# Patient Record
Sex: Female | Born: 1980 | Race: White | Hispanic: No | Marital: Single | Smoking: Never smoker
Health system: Southern US, Community
[De-identification: ages and names within clinical notes are randomized; demographics above are authoritative.]

## PROBLEM LIST (undated history)

## (undated) ENCOUNTER — Ambulatory Visit: Admission: EM | Payer: Medicaid Other | Source: Home / Self Care

## (undated) DIAGNOSIS — G90A Postural orthostatic tachycardia syndrome (POTS): Secondary | ICD-10-CM

## (undated) DIAGNOSIS — E079 Disorder of thyroid, unspecified: Secondary | ICD-10-CM

## (undated) DIAGNOSIS — Q131 Absence of iris: Secondary | ICD-10-CM

## (undated) DIAGNOSIS — F32A Depression, unspecified: Secondary | ICD-10-CM

## (undated) DIAGNOSIS — F329 Major depressive disorder, single episode, unspecified: Secondary | ICD-10-CM

## (undated) DIAGNOSIS — J45909 Unspecified asthma, uncomplicated: Secondary | ICD-10-CM

## (undated) DIAGNOSIS — E282 Polycystic ovarian syndrome: Secondary | ICD-10-CM

## (undated) DIAGNOSIS — G43909 Migraine, unspecified, not intractable, without status migrainosus: Secondary | ICD-10-CM

## (undated) DIAGNOSIS — F419 Anxiety disorder, unspecified: Secondary | ICD-10-CM

## (undated) DIAGNOSIS — E119 Type 2 diabetes mellitus without complications: Secondary | ICD-10-CM

## (undated) DIAGNOSIS — A1801 Tuberculosis of spine: Secondary | ICD-10-CM

## (undated) HISTORY — PX: APPENDECTOMY: SHX54

## (undated) HISTORY — DX: Polycystic ovarian syndrome: E28.2

## (undated) HISTORY — PX: EYE SURGERY: SHX253

## (undated) HISTORY — DX: Tuberculosis of spine: A18.01

---
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
## 1998-02-20 ENCOUNTER — Emergency Department (HOSPITAL_COMMUNITY): Admission: EM | Admit: 1998-02-20 | Discharge: 1998-02-20 | Payer: Self-pay | Admitting: Emergency Medicine

## 2004-02-29 ENCOUNTER — Emergency Department: Payer: Self-pay | Admitting: Emergency Medicine

## 2004-03-18 ENCOUNTER — Emergency Department: Payer: Self-pay | Admitting: Unknown Physician Specialty

## 2004-04-17 ENCOUNTER — Emergency Department: Payer: Self-pay | Admitting: Emergency Medicine

## 2004-04-18 ENCOUNTER — Ambulatory Visit: Payer: Self-pay | Admitting: Emergency Medicine

## 2004-08-19 ENCOUNTER — Emergency Department: Payer: Self-pay | Admitting: Emergency Medicine

## 2005-01-04 ENCOUNTER — Emergency Department: Payer: Self-pay | Admitting: Emergency Medicine

## 2005-01-04 ENCOUNTER — Other Ambulatory Visit: Payer: Self-pay

## 2005-06-09 ENCOUNTER — Other Ambulatory Visit: Payer: Self-pay

## 2005-06-09 ENCOUNTER — Emergency Department: Payer: Self-pay | Admitting: Emergency Medicine

## 2005-09-23 ENCOUNTER — Emergency Department (HOSPITAL_COMMUNITY): Admission: EM | Admit: 2005-09-23 | Discharge: 2005-09-23 | Payer: Self-pay | Admitting: Emergency Medicine

## 2005-10-19 ENCOUNTER — Emergency Department: Payer: Self-pay

## 2005-12-25 ENCOUNTER — Emergency Department: Payer: Self-pay | Admitting: Internal Medicine

## 2006-09-28 ENCOUNTER — Ambulatory Visit: Payer: Self-pay | Admitting: Internal Medicine

## 2006-10-20 ENCOUNTER — Ambulatory Visit: Payer: Self-pay | Admitting: Internal Medicine

## 2008-02-12 ENCOUNTER — Emergency Department: Payer: Self-pay | Admitting: Emergency Medicine

## 2009-01-14 ENCOUNTER — Emergency Department: Payer: Self-pay

## 2009-05-13 ENCOUNTER — Emergency Department: Payer: Self-pay | Admitting: Emergency Medicine

## 2009-08-30 ENCOUNTER — Emergency Department: Payer: Self-pay | Admitting: Emergency Medicine

## 2010-07-15 ENCOUNTER — Emergency Department: Payer: Self-pay | Admitting: Unknown Physician Specialty

## 2010-09-06 DIAGNOSIS — E1159 Type 2 diabetes mellitus with other circulatory complications: Secondary | ICD-10-CM | POA: Insufficient documentation

## 2010-09-06 DIAGNOSIS — I10 Essential (primary) hypertension: Secondary | ICD-10-CM | POA: Insufficient documentation

## 2010-09-06 DIAGNOSIS — E282 Polycystic ovarian syndrome: Secondary | ICD-10-CM | POA: Insufficient documentation

## 2010-09-06 DIAGNOSIS — E119 Type 2 diabetes mellitus without complications: Secondary | ICD-10-CM | POA: Insufficient documentation

## 2010-09-06 DIAGNOSIS — E785 Hyperlipidemia, unspecified: Secondary | ICD-10-CM | POA: Insufficient documentation

## 2010-09-06 DIAGNOSIS — E039 Hypothyroidism, unspecified: Secondary | ICD-10-CM | POA: Insufficient documentation

## 2010-10-09 DIAGNOSIS — G43019 Migraine without aura, intractable, without status migrainosus: Secondary | ICD-10-CM | POA: Insufficient documentation

## 2011-09-06 ENCOUNTER — Emergency Department: Payer: Self-pay | Admitting: Internal Medicine

## 2011-09-21 ENCOUNTER — Encounter: Payer: Self-pay | Admitting: Maternal & Fetal Medicine

## 2011-10-05 ENCOUNTER — Ambulatory Visit: Payer: Self-pay | Admitting: Maternal & Fetal Medicine

## 2011-10-19 ENCOUNTER — Encounter: Payer: Self-pay | Admitting: Maternal and Fetal Medicine

## 2011-10-19 LAB — URINALYSIS, COMPLETE
Bilirubin,UR: NEGATIVE
Blood: NEGATIVE
Glucose,UR: NEGATIVE mg/dL (ref 0–75)
Protein: NEGATIVE
RBC,UR: 2 /HPF (ref 0–5)
Specific Gravity: 1.025 (ref 1.003–1.030)
Squamous Epithelial: 8

## 2011-11-01 ENCOUNTER — Ambulatory Visit: Payer: Self-pay | Admitting: Maternal & Fetal Medicine

## 2011-11-05 ENCOUNTER — Encounter: Payer: Self-pay | Admitting: Obstetrics and Gynecology

## 2011-11-05 LAB — URINALYSIS, COMPLETE
Bilirubin,UR: NEGATIVE
Ketone: NEGATIVE
Ph: 6 (ref 4.5–8.0)
RBC,UR: 1 /HPF (ref 0–5)
Specific Gravity: 1.021 (ref 1.003–1.030)
Squamous Epithelial: 3
WBC UR: 2 /HPF (ref 0–5)

## 2011-11-17 ENCOUNTER — Encounter: Payer: Self-pay | Admitting: Pediatrics

## 2011-11-19 ENCOUNTER — Encounter: Payer: Self-pay | Admitting: Obstetrics and Gynecology

## 2011-11-19 LAB — TSH: Thyroid Stimulating Horm: 3.68 u[IU]/mL

## 2011-11-19 LAB — URINALYSIS, COMPLETE
Bilirubin,UR: NEGATIVE
Blood: NEGATIVE
Nitrite: NEGATIVE
Ph: 6 (ref 4.5–8.0)
Protein: NEGATIVE
RBC,UR: 4 /HPF (ref 0–5)
Specific Gravity: 1.019 (ref 1.003–1.030)

## 2011-12-01 ENCOUNTER — Ambulatory Visit: Payer: Self-pay | Admitting: Maternal & Fetal Medicine

## 2011-12-03 ENCOUNTER — Encounter: Payer: Self-pay | Admitting: Maternal & Fetal Medicine

## 2011-12-22 ENCOUNTER — Encounter: Payer: Self-pay | Admitting: Pediatric Cardiology

## 2011-12-30 ENCOUNTER — Observation Stay: Payer: Self-pay | Admitting: Obstetrics and Gynecology

## 2011-12-30 LAB — PIH PROFILE
BUN: 9 mg/dL (ref 7–18)
Calcium, Total: 9.6 mg/dL (ref 8.5–10.1)
Co2: 23 mmol/L (ref 21–32)
EGFR (Non-African Amer.): >60
Glucose: 68 mg/dL (ref 65–99)
HCT: 41.5 % (ref 35.0–47.0)
MCV: 82 fL (ref 80–100)
Osmolality: 280 (ref 275–301)
Platelet: 194 10*3/uL (ref 150–440)
Potassium: 3.7 mmol/L (ref 3.5–5.1)
RDW: 15.7 % — ABNORMAL HIGH (ref 11.5–14.5)
Uric Acid: 5.5 mg/dL (ref 2.6–6.0)
WBC: 13 10*3/uL — ABNORMAL HIGH (ref 3.6–11.0)

## 2011-12-30 LAB — PROTEIN / CREATININE RATIO, URINE
Creatinine, Urine: 131.8 mg/dL — ABNORMAL HIGH (ref 30.0–125.0)
Protein, Random Urine: 58 mg/dL — ABNORMAL HIGH (ref 0–12)

## 2011-12-30 LAB — CK TOTAL AND CKMB (NOT AT ARMC)
CK, Total: 31 U/L (ref 21–215)
CK-MB: <0.5 ng/mL — ABNORMAL LOW (ref 0.5–3.6)

## 2012-01-07 ENCOUNTER — Emergency Department: Payer: Self-pay | Admitting: Emergency Medicine

## 2012-01-07 LAB — URINALYSIS, COMPLETE
Bacteria: NONE SEEN
Bilirubin,UR: NEGATIVE
Glucose,UR: NEGATIVE mg/dL (ref 0–75)
Ketone: NEGATIVE
Nitrite: NEGATIVE
Ph: 5 (ref 4.5–8.0)
Squamous Epithelial: 3

## 2012-01-31 ENCOUNTER — Emergency Department: Payer: Self-pay | Admitting: Emergency Medicine

## 2012-01-31 LAB — URINALYSIS, COMPLETE
Bilirubin,UR: NEGATIVE
Ketone: NEGATIVE
Protein: NEGATIVE
RBC,UR: 1 /HPF (ref 0–5)
Squamous Epithelial: 5
WBC UR: 3 /HPF (ref 0–5)

## 2012-03-04 DIAGNOSIS — Q6589 Other specified congenital deformities of hip: Secondary | ICD-10-CM | POA: Insufficient documentation

## 2012-06-14 ENCOUNTER — Emergency Department: Payer: Self-pay | Admitting: Emergency Medicine

## 2012-06-23 ENCOUNTER — Emergency Department: Payer: Self-pay | Admitting: Emergency Medicine

## 2012-07-04 ENCOUNTER — Emergency Department: Payer: Self-pay | Admitting: Emergency Medicine

## 2012-08-23 DIAGNOSIS — Z87898 Personal history of other specified conditions: Secondary | ICD-10-CM | POA: Insufficient documentation

## 2012-11-02 IMAGING — CR DG CHEST 1V PORT
1 series · 1 of 1 positions shown · non-contrast
Comparison: none

REASON FOR EXAM: Pregnant, dyspenea, hypertension
COMMENTS:

[ap]
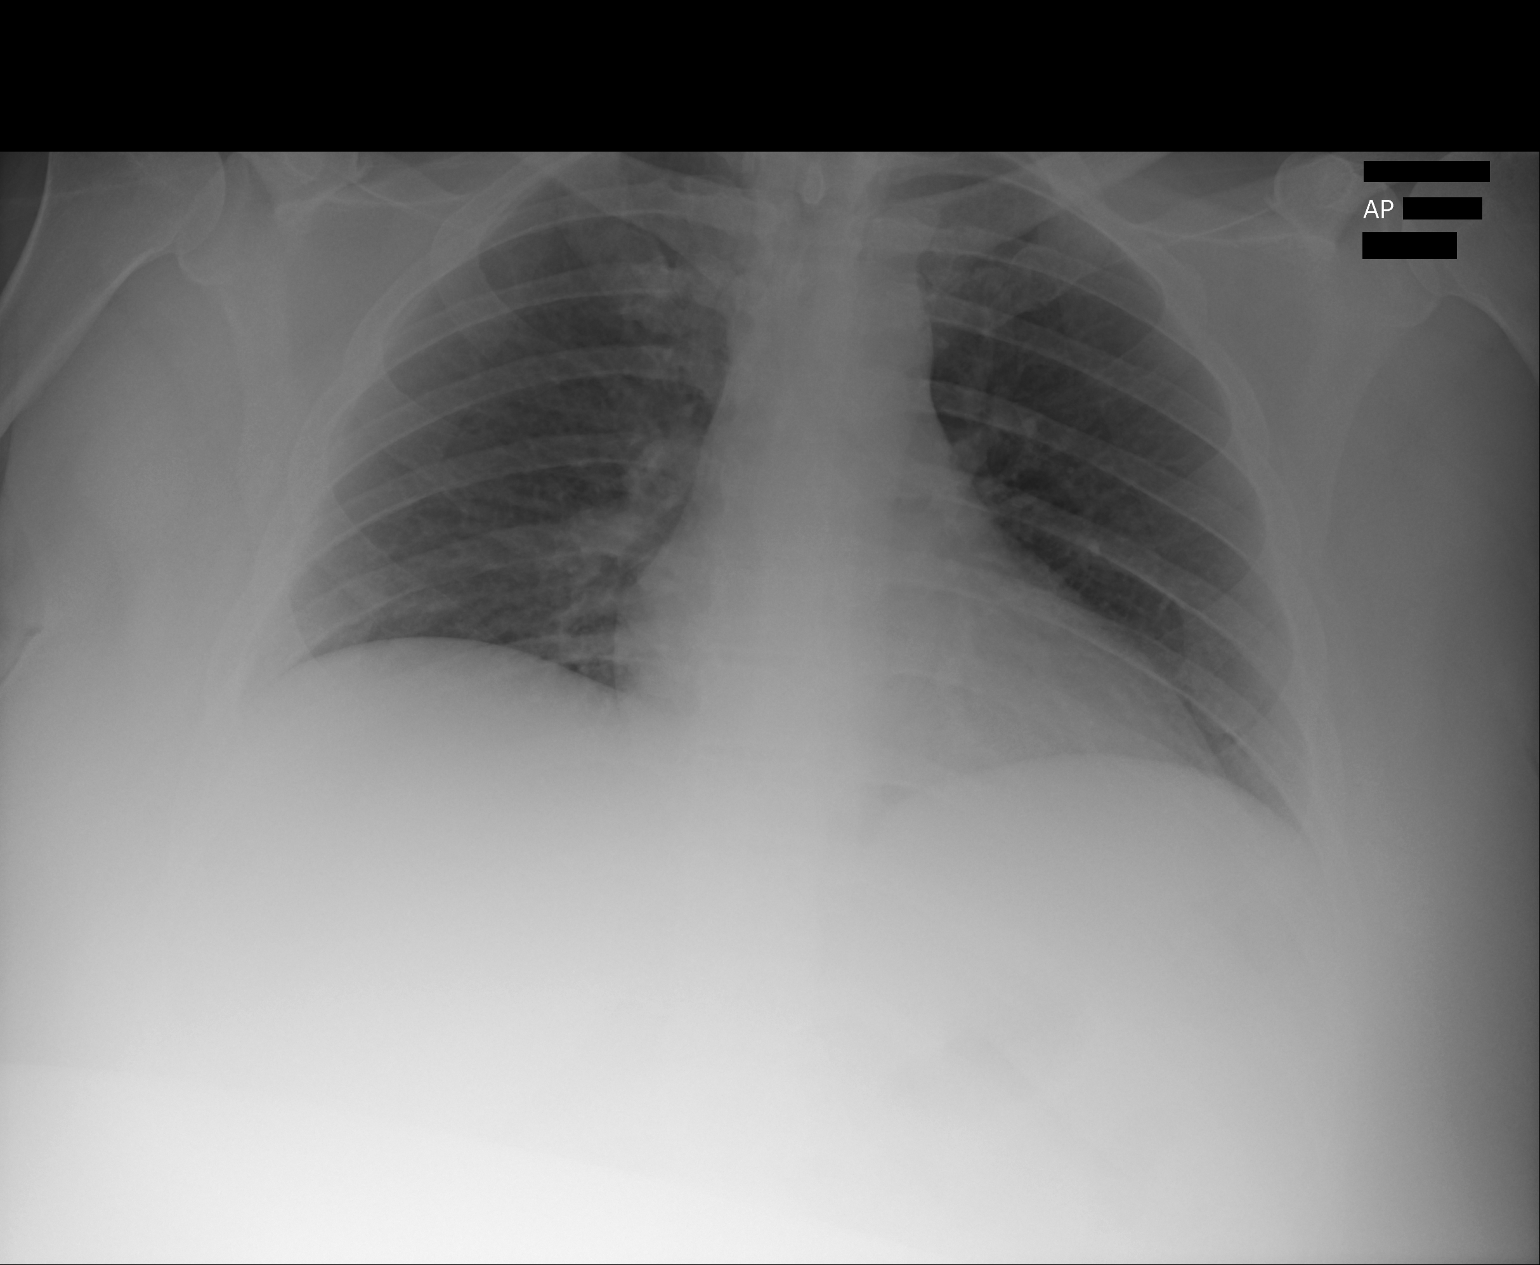

[1 of 1 positions shown; findings below may reference images not displayed]

PROCEDURE:     DXR - DXR PORTABLE CHEST SINGLE VIEW  - December 30, 2011  [DATE]

RESULT:     Comparison is made to the examination 14 January, 2009.

A there is hypoinflation. The heart is borderline enlarged which may be
normal given the gestational state. This is also made more prominent by the
hypoinflation. There is no focal infiltrate, edema, effusion or pneumothorax
evident. Bony structures appear unremarkable.
IMPRESSION: Hypoinflation. No acute cardiopulmonary disease evident.

[REDACTED]

## 2013-03-29 DIAGNOSIS — H540X55 Blindness right eye category 5, blindness left eye category 5: Secondary | ICD-10-CM | POA: Insufficient documentation

## 2013-03-29 DIAGNOSIS — F329 Major depressive disorder, single episode, unspecified: Secondary | ICD-10-CM | POA: Insufficient documentation

## 2013-03-29 DIAGNOSIS — E119 Type 2 diabetes mellitus without complications: Secondary | ICD-10-CM | POA: Insufficient documentation

## 2013-03-29 DIAGNOSIS — J45909 Unspecified asthma, uncomplicated: Secondary | ICD-10-CM | POA: Insufficient documentation

## 2013-03-29 DIAGNOSIS — F32A Depression, unspecified: Secondary | ICD-10-CM | POA: Insufficient documentation

## 2013-08-01 DIAGNOSIS — Q131 Absence of iris: Secondary | ICD-10-CM | POA: Insufficient documentation

## 2013-08-01 DIAGNOSIS — H2700 Aphakia, unspecified eye: Secondary | ICD-10-CM | POA: Insufficient documentation

## 2013-08-01 DIAGNOSIS — H11419 Vascular abnormalities of conjunctiva, unspecified eye: Secondary | ICD-10-CM | POA: Insufficient documentation

## 2013-09-18 DIAGNOSIS — K219 Gastro-esophageal reflux disease without esophagitis: Secondary | ICD-10-CM | POA: Insufficient documentation

## 2013-11-12 ENCOUNTER — Ambulatory Visit: Payer: Self-pay | Admitting: Pediatrics

## 2013-11-26 ENCOUNTER — Emergency Department: Payer: Self-pay | Admitting: Emergency Medicine

## 2014-06-19 NOTE — Consult Note (Signed)
Referral Information:   Reason for Referral 34 yo G1 at 16 weeks 6 days by ultrasound performed at [facility] on 09/08/11.  She was 10 weeks 1 day at the time of that ultrasound.  Today she presents for genetic counseling visit and follow up MFM visit. Initially She was referred for consultation due to DM on metformin (although after review of [facility] outpatient records, patient is diagnosed with 'metabolic syndrome/insulin resistance', obesity, and chronic hypertension (no medication presently).  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    Referring Physician [facility]    Prenatal Hx In reviewing her fingerstick blood glucoses, I am finding that most of her fastings are > 95 and most of her 2 hours PP are > 120 (Please note that the patient is checking 2 hours after meals and not 1 hour).   Home Medications: Medication Instructions Status  metformin 500 mg oral tablet 1 tab(s) orally 2 times a day Active  Prenatal Multivitamins oral tablet 1 tab(s) orally once a day Active  Synthroid 125 mcg oral tab 1  orally once a day Active  Aspirin Low Dose 81 mg oral tablet 1 tab(s) orally once a day Active   Allergies:   Hydrocodone: N/V/Diarrhea  Vital Signs/Notes:  Nursing Vital Signs: **Vital Signs.:   19-Aug-13 09:10   Vital Signs Type Routine   Pulse Pulse 96   Pulse source if not from Vital Sign Device dinamap   Respirations Respirations 18   Systolic BP Systolic BP 108   Diastolic BP (mmHg) Diastolic BP (mmHg) 77   Mean BP 87   BP Source  if not from Vital Sign Device dinamap   Perinatal Consult:   PMed Hx Rubella Immune    Past Medical History cont'd 1. Type 2 DM? (pt reports DM as a diagnosis, however, medical records from [facility] out[facility] describe metabolic syndrome/pcos--followed by [facility].  08/2011 Hgb A1c 5%. no history of end organ disease initially treated with metformin. Reports normalization of blood glucose  values after approx 60lb weight loss last year.  She continued metformin, but no longer needed daily blood glucose monitoring 2. Chronic Hypertension--for approx 2 years--most recently on propranolol (for both hypertension and migraines)  3. Migraines--relieved with elavil and topomax (no longer taking either med).  has been followed by [facility] in past 4. Hypothyroidism--since age 34 5. Aniridia(iris hypoplasia)--followed by [facility] specialist. [facility]  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  Pt. is legally blind 6. 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 Aniridia -- see genetic counseling note 2. Diabetes - poor control    Recommendations 1. See genetic counseling note 2. I am starting the patient on glyburide 2.5 mg twice daily 3. Detailed U/S in 2 weeks -- scheduled 4. MFM follow up in 2 weeks -- scheduled 5. Fetal echo in 4 weeks -- previously scheduled   Plan:   Genetic Counseling yes, in 4 weeks (scheduled)    Prenatal Diagnosis Options Level II US    Ultrasound at what gestational ages Monthly > 28 weeks    Antepartum Testing Starting at 32 weeks    Additional Testing Folate/prenatal vitamins    Delivery at what gestational age 639w, earlier if evidence  of fetopathy     Total Time Spent with Patient 15 minutes    >50% of visit spent in couseling/coordination of care yes    Office Use Only [address]9213  Office Visit Level 3 ( ) EST exp prob focused outpt   Coding Description: MATERNAL CONDITIONS/HISTORY INDICATION(S).   Diabetes - DM.  Electronic Signatures: Marcelino Scot (MD)  (Signed 19-Aug-13 10:45)  Authored: Referral, Home Medications, Allergies, Vital Signs/Notes, Consult, Impression, Plan, Billing, Coding  Description   Last Updated: 19-Aug-13 10:45 by Marcelino Scot (MD)

## 2014-06-19 NOTE — Consult Note (Signed)
Referral Information:   Reason for Referral 34 yo WF G1 POat 19 weeks 2 days by ultrasound performed at [facility] on 09/08/11.  She was 10 weeks 1 day at the time of that ultrasound.  Follow up consultation due to DM /metabolic syndrome/insulin resistance' on metformin and glyburide , obesity, and chronic hypertension (no medication presently).    Referring Physician [facility]    Prenatal Hx - Diabetes likely type 2 - on metformin prior to conception and now glyburide 2.5 bid - ran out recently due to pharmacy mix up  FBS most in range, 2 h pp several in 130-140 range  -HTN no meds on ASA  - aniridia - legally blind - had genetic counselling 50% inheritance - declined furhter prenatal dx- pt is able to read her cell phone  -Hypothryroidism- on synthroid - dose dropped in pregnancy  -pt c/o abdominal yeast under pannous bled after u/s has been using cream    Past Obstetrical Hx primigravida   Home Medications: Medication Instructions Status  metformin 500 mg oral tablet 1 tab(s) orally 2 times a day Active  Prenatal Multivitamins oral tablet 1 tab(s) orally once a day Active  Synthroid 125 mcg oral tab 1  orally once a day Active  Aspirin Low Dose 81 mg oral tablet 1 tab(s) orally once a day Active  glyBURIDE 2.5 milligram(s) orally 2 times a day Active   Allergies:   Hydrocodone: N/V/Diarrhea  Vital Signs/Notes:  Nursing Vital Signs: **Vital Signs.:   05-Sep-13 09:11   Vital Signs Type Routine   Temperature Temperature (F) 98   Celsius 36.6   Temperature Source oral   Pulse Pulse 93   Pulse source if not from Vital Sign Device dinamap   Respirations Respirations 16   Systolic BP Systolic BP 124   Diastolic BP (mmHg) Diastolic BP (mmHg) 68   Mean BP 86   BP Source  if not from Vital Sign Device dinamap   Perinatal Consult:   PMed Hx Rubella Immune    Past Medical History cont'd 1.Likely  Type 2 DM on meds in pregnancy(pt reports DM as a diagnosis, however, medical  records from [facility] describe metabolic syndrome/pcos--followed by [facility].  08/2011 Hgb A1c 5%. no history of end organ disease initially treated with metformin. Reports normalization of blood glucose values after approx 60lb weight loss last year.  She continued metformin, but no longer needed daily blood glucose monitoring 2. Chronic Hypertension--for approx 2 years--now off  propranolol  usign ASA 81 mg  3. Migraines--relieved with elavil and topomax (no longer taking either med).  has been followed by [facility] in past 4. Hypothyroidism--since age 43 5. Aniridia(iris hypoplasia)--followed by [facility]. She has been seen by medical genetics at [facility].  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  Pt. is legally blind declined testing . 6. 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    Soc Hx married, no tobacco, etoh, ilcit drug use   Review Of Systems:   Tolerating Diet Yes      Routine UA:  19-Aug-13 09:08    Color (UA) Amber   Glucose (UA) Negative   Bilirubin (UA) Negative   Ketones (UA) Trace   Specific Gravity (UA) 1.025   Blood (UA) Negative   pH (UA) 5.0   Protein (UA) Negative   Nitrite (UA) Negative   Leukocyte Esterase (UA) Trace (Result(s) reported  on 19 Oct 2011 at 10:26AM.)   RBC (UA) 2 /HPF   WBC (UA) 14 /HPF   Bacteria (UA) 3+   Epithelial Cells (UA) 8 /HPF   Mucous (UA) PRESENT   Amorphous Crystal (UA) PRESENT (Result(s) reported on 19 Oct 2011 at 10:26AM.)  05-Sep-13 09:02    Color (UA) Yellow   Clarity (UA) Hazy   Glucose (UA) Negative   Bilirubin (UA) Negative   Ketones (UA) Negative   Specific Gravity (UA) 1.021   Blood (UA) Negative   pH (UA) 6.0   Protein (UA) Negative   Nitrite (UA) Negative   Leukocyte Esterase (UA) Trace (Result(s) reported on 05 Nov 2011 at 11:05AM.)   RBC (UA) 1 /HPF   WBC (UA) 2 /HPF   Bacteria (UA) 1+    Epithelial Cells (UA) 3 /HPF   Mucous (UA) PRESENT   Calcium Oxalate Crystal (UA) PRESENT (Result(s) reported on 05 Nov 2011 at 11:05AM.)     Additional Lab/Radiology Notes FBS-93 111 (2/7 >100) some off glyburide  2h pp breakfast 95-159 (3/7 >120) 2 h lunch 92 -132 (2/3 >120) 2h supper 120-135 (1/2 >120)   Impression/Recommendations:   Impression 1. Diabetes likely Type 2 onmetformin and glyburide   - most FBS in range - a few 2 h pp out of range ran out of glyburide  2. HTN normal BP today no meds on ASA 3 hypothyridism on synthroid  4 aniridia - low vision legally blind able to function well 4 candida on abdomen    Recommendations 1 . restart glyburide continue metformin - I reviewed wiht pt that while metformin is a small molecule and crosses placenta there is emerging safety data in pregnancy - I offered option of stopping and adjusting only glyburide - pt prefers to continue metformin will check Hgb A1c  2. MFM follow up in 2 weeks -- scheduled to adjust meds  3. Fetal echo in 3 weeks -- previously scheduled 4 follow up u/s - anatomy limited by pt size  5 nystatin powder for pannous and gauze to keep dry suggested 6 TFTs ordered   Plan:   Genetic Counseling yes, done    Prenatal Diagnosis Options Level II US, done    Ultrasound at what gestational ages Monthly > 28 weeks    Antepartum Testing Starting at 32 weeks    Delivery Mode Vaginal    Additional Testing Thyroid panel, Folate/prenatal vitamins    Delivery at what gestational age 4639w, earlier if evidence of fetopathy     Total Time Spent with Patient 15 minutes    >50% of visit spent in couseling/coordination of care yes    Office Use Only 99213  Office Visit Level 3 (15min) EST exp prob focused outpt   Coding Description: MATERNAL CONDITIONS/HISTORY INDICATION(S).   Diabetes - DM.  Electronic Signatures: Rondall AllegraLivingston, Anella Nakata Gresham (MD)  (Signed 05-Sep-13 13:07)  Authored: Referral, Home  Medications, Allergies, Vital Signs/Notes, Consult, Exam, Lab, Lab/Radiology Notes, Impression, Plan, Billing, Coding Description   Last Updated: 05-Sep-13 13:07 by Rondall AllegraLivingston, Teniqua Marron Gresham (MD)

## 2014-06-19 NOTE — Consult Note (Signed)
Referral Information:   Reason for Referral 34 yo WF G1 POat 21 weeks 2 days by ultrasound [facility].  She was 10 weeks 1 day at the time of that ultrasound.  Follow up consultation due to DM /metabolic syndrome/insulin resistance' on metformin and glyburide , obesity, and chronic hypertension (no medication presently).    Referring Physician [facility]    Prenatal Hx - Diabetes likely type 2 - on metformin prior to conception and now added glyburide 2.5 bid - ran out and was to restart  2 weeks ago - she was unable to refill meds at pharmacy so didn't start until yesterday - RBS 90 minutes after eating 190 here to recheck blood sugars and ajust meds  FBS most in range, 2 h pp several in 150-160 range on metformin only  -HTN no BP meds on ASA  - aniridia - legally blind - had genetic counselling 50% inheritance - declined further prenatal dx- pt is able to read her cell phone  -Hypothryroidism- on synthroid - dose dropped in pregnancy  -abdominal yeast under pannous Rx given last visit    Past Obstetrical Hx primigravida   Home Medications: Medication Instructions Status  metformin 500 mg oral tablet 1 tab(s) orally 2 times a day Active  Prenatal Multivitamins oral tablet 1 tab(s) orally once a day Active  Synthroid 125 mcg oral tab 1  orally once a day Active  Aspirin Low Dose 81 mg oral tablet 1 tab(s) orally once a day Active  glyBURIDE 2.5 milligram(s) orally 2 times a day Active   Allergies:   Hydrocodone: N/V/Diarrhea  Vital Signs/Notes:  Nursing Vital Signs: **Vital Signs.:   19-Sep-13 09:37   Vital Signs Type Routine   Temperature Temperature (F) 98.3   Celsius 36.8   Temperature Source oral   Pulse Pulse 94   Pulse source if not from Vital Sign Device dinamap   Respirations Respirations 16   Systolic BP Systolic BP 123   Diastolic BP (mmHg) Diastolic BP (mmHg) 65   Mean BP 84   BP Source  if not from Vital Sign Device dinamap    Perinatal Consult:   PMed Hx Rubella Immune    Past Medical History cont'd 1.Likely  Type 2 DM on meds in pregnancy(pt reports DM as a diagnosis, however, [facility] describe metabolic syndrome/pcos--follow[facility] by [facility].  08/2011 Hgb A1c 5%. no history of end organ disease initially treated with metformin. Reports normalization of blood glucose values after approx 60lb weight loss last year.  She continued metformin, but no longer needed daily blood glucose monitoring 2. Chronic Hypertension--for approx 2 years--now off  propranolol  usign ASA 81 mg  3. Migraines--relieved with elavil and topomax (no longer taking either med).  has been followed by [facility] in past 4. Hypothyroidism--since age 34 5. Aniridia(iris hypoplasia)--followed by [facility]. [facility]  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  Pt. is legally blind declined testing . 6. 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     Routine UA:  05-Sep-13 09:02    Color (UA) Yellow   Clarity (UA)  Hazy   Glucose (UA) Negative   Bilirubin (UA) Negative   Ketones (UA) Negative   Specific Gravity (UA) 1.021   Blood (UA) Negative   pH (UA) 6.0   Protein (UA) Negative   Nitrite (UA) Negative   Leukocyte Esterase (UA) Trace (Result(s) reported on 05 Nov 2011 at 11:05AM.)   RBC (UA) 1 /HPF   WBC (UA) 2 /HPF   Bacteria (UA) 1+   Epithelial Cells (UA) 3 /HPF   Mucous (UA) PRESENT   Calcium Oxalate Crystal (UA) PRESENT (Result(s) reported on 05 Nov 2011 at 11:05AM.)     Additional Lab/Radiology Notes FBS-84 115 (2/7 >100)  off glyburide  2h pp breakfast 90-192 (5/7 >120) 2 h lunch 110 -156 (6/7  >120) 2h supper 119-156 (4/5 >120)   Impression/Recommendations:   Impression 1. Diabetes likely Type 2 onmetformin and just started glyburide   - most FBS in range - most 2 h pp out of range off glyburide - just started Yesterday! I encouraged her to adhere to glyburide re-reviewed worries for fetal macrosomia and more complications if not euglycemic. 2. HTN normal BP today no meds on ASA  3 hypothyridism on synthroid  4 aniridia - low vision legally blind able to function well 4 candida on abdomen improved 4 Increased BMI weight 254 BMI 39    Recommendations 1 . TAKE glyburide will increase to 5 in am and 2.5 at night continue metformin - I reviewed wiht pt that while metformin is a small molecule and crosses placenta there is emerging safety data in pregnancy - I offered option of stopping and adjusting only glyburide - pt prefers to continue metformin will check Hgb A1c today  2. MFM follow up in 2 weeks -- scheduled to adjust meds advised to take them and notify office if pharmacy won't fill 3. Fetal echo already scheduled 4 follow up u/s - anatomy limited by pt size  5 conitnue nystatin powder for pannous and gauze to keep dry suggested 6 TFTs ordered today   Plan:   Genetic Counseling yes, done    Prenatal Diagnosis Options Level II [place], done    Ultrasound at what gestational ages Monthly > 28 weeks    Antepartum Testing Starting at 32 weeks    Delivery Mode Vaginal    Additional Testing Thyroid panel, Folate/prenatal vitamins    Delivery at what gestational age 80w, earlier if evidence of fetopathy     Total Time Spent with Patient 15 minutes    >50% of visit spent in couseling/coordination of care yes    Office Use Only 99213  Office Visit Level 3 ( ) EST exp prob focused outpt   Coding Description: MATERNAL CONDITIONS/HISTORY INDICATION(S).   Diabetes - DM.  Electronic Signatures: Rondall Allegra (MD)  (Signed 19-Sep-13 13:30)  Authored:  Referral, Home Medications, Allergies, Vital Signs/Notes, Consult, Exam, Lab, Lab/Radiology Notes, Impression, Plan, Billing, Coding Description   Last Updated: 19-Sep-13 13:30 by Rondall Allegra (MD)

## 2014-06-19 NOTE — Consult Note (Signed)
Referral Information:   Reason for Referral 34 yo G1 at 12 weeks 6 days by ultrasound performed at [facility] on 09/08/11.  She was 10 weeks 1 day at the time of that ultrasound.  She is referred for consultation due to DM on metformin (although after review of [facility] outpatient records, patient is diagnosed with 'metabolic syndrome/insulin resistance', obesity, and chronic hypertension (no medication presently).  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    Referring Physician [facility]    Prenatal Hx 08/2011 Hgb A1c 5%.  She was diagnosed with metabolic syndrome based, in part, on a Hgb A1c of 6%    Past Obstetrical Hx nuliparous history of infertility   Home Medications: Medication Instructions Status  Synthroid 150 mcg (0.15 mg) oral tablet 1 tab(s) orally once a day Active  metformin 500 mg oral tablet 1 tab(s) orally 2 times a day Active  Prenatal Multivitamins oral tablet 1 tab(s) orally once a day Active   Allergies:   Hydrocodone: N/V/Diarrhea  Vital Signs/Notes:  Nursing Vital Signs: **Vital Signs.:   22-Jul-13 09:05   Vital Signs Type Routine   Temperature Temperature (F) 97   Celsius 36.1   Temperature Source oral   Pulse source if not from Vital Sign Device dinamap   Respirations Respirations 16   Systolic BP Systolic BP 118   Diastolic BP (mmHg) Diastolic BP (mmHg) 73   Mean BP 88   BP Source  if not from Vital Sign Device dinamap, lf arm large cuff pt sitting no BP medication   Perinatal Consult:   PMed Hx Rubella Immune    Past Medical History cont'd 1. Type 2 DM? (pt reports DM as a diagnosis, however, medical records from [facility] outpt describe metabolic syndrome/pcos--followed by [facility] primary [facility].  08/2011 Hgb A1c 5%. no history of end organ disease initially treated with metformin. Reports normalization of blood glucose values after approx 60lb weight loss last year.  She continued metformin, but no  longer needed daily blood glucose monitoring 2. Chronic Hypertension--for approx 2 years--most recently on propranolol (for both hypertension and migraines)  3. Migraines--relieved with elavil and topomax (no longer taking either med).  has been followed by [facility] neurology in past 4. Hypothyroidism--since age 40 5. Aniridia(iris hypoplasia)--followed by [facility] retina specialist. She has been seen by medical genetics at [facility].  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  Pt. is legally blind 6. 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 Chronic hypertension--we reviewed concerns associated with chronic hypertension such as superimposed preeclampsia and fetal growth restriction.  We discussed the fetal surveillance (outlined below) and baseline lab assessments recommended.  2. Type 2 DM(?)/metabolic syndrome/PCOS--She reports diagnosis of DM approximately 1 year ago and initially controlled blood glucose with just metformin and low carbohydrate diet.   We discussed the risks of pregestational diabetes, including worsening glycemic control, pregnancy related hypertensive disorders, fetal growth restriction, polyhydramnios, fetal congenital anomalies (a Hgb A1c of less than 8% is associated with a 3% risk for congenital anomaly), fetal demise, and iatrogenic preterm delivery.  We encouraged her to maintain good glycemic control to lower the risk of these adverse outcomes.    I REVEIWED HER [facility] OUTPT RECORDS FURTHER AFTER OUR VISIT AND SHE DOES NOT APPEAR TO HAVE  A CLEAR DIAGNOSIS OF TYPE 2 DM BUT OF METABOLIC SYNDROME/INSULIN RESISTANCE. The recommendations for surevillance and pregnancy evaluation, however, are not affected.  We can discuss this further with pt at follow up visit.  3. obesity--we reviewed concerns related to  elevated bmi in pregnancy and surveillance during pregnancy (risk and surveillance  those outlined for hypertension and DM, including increased risk for operative delivery, anomalies, and need for growth surveillance).   4. Emeline Darlingniridia--She will meet with our genetic counselor in 4 weeks. Prenatal diagnosis is an option (she has PAX 6 mutation) 5. Migraines--topirimate helped symptoms in past.  Advised patient this therapy could be continued during pregnancy.   6. Fatigue--symptoms preceeded pregnancy.  She had pulmonary evaluation/sleep study and was NOT diagnosed with sleep apnea but was noted to have upper airway obstructive symptoms.  They did not feel CPAP would be beneficial. They advised weight reduction. 7. Hypothryoidism--well controlled on synthroid.    Recommendations 1. Continue monitoring the finger stick glucose.  I wrote prescription for glucometer and supplies today.  I recommended  adjustment of diet to maintain fastings <95 mg/dl and one-hour post-prandials <140 mg/dl.  If there are any abnormalities with her glycemic control, we will initiate insulin as necessary.  We have also referred [facility] to [facility] for both nutrition counseling and diabetes care.  We addressed the possiblity that she may require insulin therapy during pregnancy.  I would consider initiation of glyburide if we observe slight elevations in her blood glucose values.   2.Nutrition counseling--she reports difficulty with diet during pregnancy due to her concerns about the low carbohydrate diet she previously followed and her need to balance meals during pregnancy.  The low carbohydrate diet may be appropriate for her, however, she should review food diary with nutritionist. 3.  Baseline p:c ratio and metabolic panel/lfts, if not already obtained.  If the p:c ratio is abnormal, she should have a 24 hour urine protein.  4. Baseline EKG; maternal echocardiogram should be considered if the patient has an abnormal EKG  or cardiac symptoms  5. Baseline thyroid function tests each trimester.  6. Close monitoring of blood pressure and appropriate antihypertensive medication should this be necessary. 7.Second trimester targeted ultrasound (~18 weeks). 8. Fetal echocardiogram at 22 weeks (scheduled) 9. Antepartum testing and growth ultrasound surveillance, as outlined below.  10. Delivery should be considered between 37 and 40 weeks, with consideration for testing of fetal lung maturity if initiating delivery before 39 weeks 11. We have scheduled a follow up mfm consult and genetic consult in 4 weeks.   Plan:   Genetic Counseling yes, in 4 weeks (scheduled)    Prenatal Diagnosis Options Level II US    Ultrasound at what gestational ages Monthly > 28 weeks    Antepartum Testing Starting at 32 weeks    Additional Testing Folate/prenatal vitamins    Delivery at what gestational age 7539w, earlier if evidence of fetopathy     Total Time Spent with Patient 45 minutes    >50% of visit spent in couseling/coordination of care yes    Office Use Only 99243  Level 3 (40min) NEW office consult detailed   Coding Description: MATERNAL CONDITIONS/HISTORY INDICATION(S).   Chronic HTN.   Diabetes - DM.   Obesity - BMI greater than equal to 30.   Thyroid dysfunction in pregnancy.  Electronic Signatures: Consuelo PandySmall, Jyden Kromer (MD)  (Signed 22-Jul-13 13:25)  Authored: Referral, Home Medications, Allergies, Vital Signs/Notes, Consult, Impression, Plan, Billing, Coding Description   Last Updated:  22-Jul-13 13:25 by Consuelo Pandy (MD)

## 2014-07-10 NOTE — H&P (Signed)
L&D Evaluation:  History:   HPI 34 yo G1 at 6582w0d by D=10w US derived EDC of 03/31/2011 presenting with worsening SOB in the last 48hrs in the setting of history of asthma, CHTN (on unknown dose propranolol outside of pregnancy), anxiety/depression, as well as diabetes.  Was seen in clinic today, BP 130/84 negative protein on urine dip.  The patient had a peak flow of 300, was given Rx for albuterol and told to represent if symptoms worsen. She denies cough, fevers, chills, increased edema (only 1lb weight gain in the last week per clinic records), lower extremity pain.  States chest pain feel like pressure.  Decreased fetal movement but did not feel movement until about a week ago, no LOF, no VB, no ctx  PNL: A + / ABSC neg / RI / VZI / HIV neg / HBsAg neg / pap wnl with neg HPV / GC&CT negative.  Normal baseline EKG, no baseline 24-hr urine.  Hgb A1C 4.5.TSH 3.68, FT4 1.01    Presents with shortness of breath    Patient's Medical History Asthma  Diabetes  Hypertension  anxiety, depression, migraines    Patient's Surgical History Appendectomy  cataract surgery (legaly blind), wisdome teeth extraction    Medications Pre Natal Vitamins  ASA 81mg  po daily, metformin 500mg  po bid, glyburide 5mg  po qAM 2.5mg  qPM, zantac 150mg  po daily    Allergies Hydrocodone    Social History none    Family History Non-Contributory   ROS:   ROS See HPI   Exam:   Vital Signs BP >140/90  190/100    Urine Protein UA and urine protein/creatnine dip pendig    General no apparent distress    Mental Status clear    Chest clear    Heart normal sinus rhythm    Abdomen gravid, non-tender    Estimated Fetal Weight Average for gestational age    Edema 1+    Reflexes 1+    Clonus negative    FHT + FHT difficulty monitoring secondary to patient habitus   Impression:   Impression evaluation for PIH, 34 yo G1 @ 5582w0d with CHTN vs SiPreE   Plan:   Plan EFM/NST, monitor BP, PIH panel    Comments  - Check pre-eclampsia labs and urine pr/cr ratio.  If laboratory evaluation supports pre-e will start patient on magnesium sulfate and given gestational age will initiate transfer to [facility], start BMZ, start ampicillin for GBS unknown - Hydralazine 5mg  IV once, repeat 10mg  IV once if BP in 20 minutes if remain 160/110, with a third dose of 10mg  if remains elevated - CXR to r/o cardiac enlargement/cardiomyopathy - O2 sats normal on RA - Low suspicion for PE, not tachycardic - EKG   Electronic Signatures: Lorrene ReidStaebler, Cordella Nyquist M (MD)  (Signed 30-Oct-13 02:46)  Authored: L&D Evaluation   Last Updated: 30-Oct-13 02:46 by Lorrene ReidStaebler, Kirbie Stodghill M (MD)

## 2014-10-07 ENCOUNTER — Encounter: Payer: Self-pay | Admitting: *Deleted

## 2014-10-07 ENCOUNTER — Emergency Department
Admission: EM | Admit: 2014-10-07 | Discharge: 2014-10-07 | Disposition: A | Discharge status: Home / Self Care | Payer: Medicaid Other | Attending: Student | Admitting: Student

## 2014-10-07 ENCOUNTER — Emergency Department: Payer: Medicaid Other

## 2014-10-07 DIAGNOSIS — Y92410 Unspecified street and highway as the place of occurrence of the external cause: Secondary | ICD-10-CM | POA: Diagnosis not present

## 2014-10-07 DIAGNOSIS — Y998 Other external cause status: Secondary | ICD-10-CM | POA: Insufficient documentation

## 2014-10-07 DIAGNOSIS — S161XXA Strain of muscle, fascia and tendon at neck level, initial encounter: Secondary | ICD-10-CM | POA: Diagnosis not present

## 2014-10-07 DIAGNOSIS — Y9389 Activity, other specified: Secondary | ICD-10-CM | POA: Insufficient documentation

## 2014-10-07 DIAGNOSIS — S20212A Contusion of left front wall of thorax, initial encounter: Secondary | ICD-10-CM | POA: Insufficient documentation

## 2014-10-07 DIAGNOSIS — S199XXA Unspecified injury of neck, initial encounter: Secondary | ICD-10-CM | POA: Diagnosis present

## 2014-10-07 MED ORDER — IBUPROFEN 800 MG PO TABS
800.0000 mg | ORAL_TABLET | Freq: Three times a day (TID) | ORAL | Status: DC | PRN
Start: 2014-10-07 — End: 2014-10-07

## 2014-10-07 MED ORDER — TRAMADOL HCL 50 MG PO TABS
50.0000 mg | ORAL_TABLET | Freq: Four times a day (QID) | ORAL | Status: DC | PRN
Start: 2014-10-07 — End: 2014-10-07

## 2014-10-07 MED ORDER — IBUPROFEN 800 MG PO TABS: 800.0000 mg | ORAL_TABLET | Freq: Three times a day (TID) | ORAL | Status: DC | PRN

## 2014-10-07 MED ORDER — TRAMADOL HCL 50 MG PO TABS: 50.0000 mg | ORAL_TABLET | Freq: Four times a day (QID) | ORAL | Status: DC | PRN

## 2014-10-07 MED ORDER — CYCLOBENZAPRINE HCL 10 MG PO TABS: 10.0000 mg | ORAL_TABLET | Freq: Three times a day (TID) | ORAL | Status: DC | PRN

## 2014-10-07 MED ORDER — CYCLOBENZAPRINE HCL 10 MG PO TABS
10.0000 mg | ORAL_TABLET | Freq: Once | ORAL | Status: AC
  Administered 2014-10-07: 10 mg via ORAL
  Filled 2014-10-07: qty 1

## 2014-10-07 MED ORDER — IBUPROFEN 800 MG PO TABS
800.0000 mg | ORAL_TABLET | Freq: Once | ORAL | Status: AC
  Administered 2014-10-07: 800 mg via ORAL
  Filled 2014-10-07: qty 1

## 2014-10-07 MED ORDER — TRAMADOL HCL 50 MG PO TABS
50.0000 mg | ORAL_TABLET | Freq: Once | ORAL | Status: AC
  Administered 2014-10-07: 50 mg via ORAL
  Filled 2014-10-07: qty 1
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
## 2014-10-07 NOTE — Discharge Instructions (Signed)

## 2015-02-04 ENCOUNTER — Observation Stay
Admission: EM | Admit: 2015-02-04 | Discharge: 2015-02-06 | Disposition: A | Discharge status: Home / Self Care | Payer: Medicaid Other | Attending: Surgery | Admitting: Surgery

## 2015-02-04 ENCOUNTER — Emergency Department: Payer: Medicaid Other

## 2015-02-04 DIAGNOSIS — R109 Unspecified abdominal pain: Secondary | ICD-10-CM | POA: Diagnosis present

## 2015-02-04 DIAGNOSIS — K8050 Calculus of bile duct without cholangitis or cholecystitis without obstruction: Secondary | ICD-10-CM | POA: Insufficient documentation

## 2015-02-04 DIAGNOSIS — Z9889 Other specified postprocedural states: Secondary | ICD-10-CM | POA: Insufficient documentation

## 2015-02-04 DIAGNOSIS — Z825 Family history of asthma and other chronic lower respiratory diseases: Secondary | ICD-10-CM | POA: Diagnosis not present

## 2015-02-04 DIAGNOSIS — K8020 Calculus of gallbladder without cholecystitis without obstruction: Secondary | ICD-10-CM | POA: Diagnosis not present

## 2015-02-04 DIAGNOSIS — Z9049 Acquired absence of other specified parts of digestive tract: Secondary | ICD-10-CM | POA: Diagnosis not present

## 2015-02-04 DIAGNOSIS — F329 Major depressive disorder, single episode, unspecified: Secondary | ICD-10-CM | POA: Diagnosis not present

## 2015-02-04 DIAGNOSIS — K8010 Calculus of gallbladder with chronic cholecystitis without obstruction: Principal | ICD-10-CM | POA: Insufficient documentation

## 2015-02-04 DIAGNOSIS — I10 Essential (primary) hypertension: Secondary | ICD-10-CM | POA: Insufficient documentation

## 2015-02-04 DIAGNOSIS — Z6839 Body mass index (BMI) 39.0-39.9, adult: Secondary | ICD-10-CM | POA: Diagnosis not present

## 2015-02-04 DIAGNOSIS — J45909 Unspecified asthma, uncomplicated: Secondary | ICD-10-CM | POA: Insufficient documentation

## 2015-02-04 DIAGNOSIS — Z8249 Family history of ischemic heart disease and other diseases of the circulatory system: Secondary | ICD-10-CM | POA: Insufficient documentation

## 2015-02-04 DIAGNOSIS — E039 Hypothyroidism, unspecified: Secondary | ICD-10-CM | POA: Diagnosis not present

## 2015-02-04 DIAGNOSIS — Z79899 Other long term (current) drug therapy: Secondary | ICD-10-CM | POA: Insufficient documentation

## 2015-02-04 DIAGNOSIS — E119 Type 2 diabetes mellitus without complications: Secondary | ICD-10-CM | POA: Diagnosis not present

## 2015-02-04 DIAGNOSIS — F419 Anxiety disorder, unspecified: Secondary | ICD-10-CM | POA: Insufficient documentation

## 2015-02-04 HISTORY — DX: Type 2 diabetes mellitus without complications: E11.9

## 2015-02-04 HISTORY — DX: Major depressive disorder, single episode, unspecified: F32.9

## 2015-02-04 HISTORY — DX: Unspecified asthma, uncomplicated: J45.909

## 2015-02-04 HISTORY — DX: Disorder of thyroid, unspecified: E07.9

## 2015-02-04 HISTORY — DX: Depression, unspecified: F32.A

## 2015-02-04 HISTORY — DX: Anxiety disorder, unspecified: F41.9

## 2015-02-04 LAB — LIPASE, BLOOD: Lipase: 25 U/L (ref 11–51)

## 2015-02-04 LAB — URINALYSIS COMPLETE WITH MICROSCOPIC (ARMC ONLY)
Bacteria, UA: NONE SEEN
Bilirubin Urine: NEGATIVE
GLUCOSE, UA: NEGATIVE mg/dL
Hgb urine dipstick: NEGATIVE
Ketones, ur: NEGATIVE mg/dL
LEUKOCYTES UA: NEGATIVE
Nitrite: NEGATIVE
Protein, ur: NEGATIVE mg/dL
RBC / HPF: NONE SEEN RBC/hpf (ref 0–5)
SPECIFIC GRAVITY, URINE: 1.014 (ref 1.005–1.030)
pH: 5 (ref 5.0–8.0)

## 2015-02-04 LAB — COMPREHENSIVE METABOLIC PANEL
ALK PHOS: 57 U/L (ref 38–126)
ALT: 26 U/L (ref 14–54)
AST: 31 U/L (ref 15–41)
Albumin: 3.7 g/dL (ref 3.5–5.0)
Anion gap: 6 (ref 5–15)
BILIRUBIN TOTAL: 0.7 mg/dL (ref 0.3–1.2)
BUN: 9 mg/dL (ref 6–20)
CO2: 24 mmol/L (ref 22–32)
Calcium: 9.1 mg/dL (ref 8.9–10.3)
Chloride: 105 mmol/L (ref 101–111)
Creatinine, Ser: 0.92 mg/dL (ref 0.44–1.00)
GFR calc Af Amer: >60 mL/min (ref >60)
Glucose, Bld: 137 mg/dL — ABNORMAL HIGH (ref 65–99)
Potassium: 3.7 mmol/L (ref 3.5–5.1)
Sodium: 135 mmol/L (ref 135–145)
TOTAL PROTEIN: 7.1 g/dL (ref 6.5–8.1)

## 2015-02-04 LAB — CBC
HCT: 38.5 % (ref 35.0–47.0)
Hemoglobin: 12.9 g/dL (ref 12.0–16.0)
MCH: 25.3 pg — ABNORMAL LOW (ref 26.0–34.0)
MCHC: 33.4 g/dL (ref 32.0–36.0)
MCV: 75.6 fL — ABNORMAL LOW (ref 80.0–100.0)
PLATELETS: 268 10*3/uL (ref 150–440)
RBC: 5.1 MIL/uL (ref 3.80–5.20)
RDW: 15.3 % — ABNORMAL HIGH (ref 11.5–14.5)
WBC: 10.5 10*3/uL (ref 3.6–11.0)

## 2015-02-04 LAB — POCT PREGNANCY, URINE: Preg Test, Ur: NEGATIVE

## 2015-02-04 MED ORDER — GI COCKTAIL ~~LOC~~
30.0000 mL | Freq: Once | ORAL | Status: AC
  Administered 2015-02-04: 30 mL via ORAL

## 2015-02-04 MED ORDER — GI COCKTAIL ~~LOC~~
ORAL | Status: AC
  Administered 2015-02-04: 30 mL via ORAL
  Filled 2015-02-04: qty 30

## 2015-02-04 MED ORDER — ONDANSETRON HCL 4 MG/2ML IJ SOLN
4.0000 mg | Freq: Once | INTRAMUSCULAR | Status: AC
  Administered 2015-02-04: 4 mg via INTRAVENOUS
  Filled 2015-02-04: qty 2

## 2015-02-04 MED ORDER — MORPHINE SULFATE (PF) 2 MG/ML IV SOLN
2.0000 mg | Freq: Once | INTRAVENOUS | Status: AC
  Administered 2015-02-04: 2 mg via INTRAVENOUS
  Filled 2015-02-04: qty 1

## 2015-02-04 NOTE — ED Notes (Signed)
Pt c/o epigastric/ chest pain intermittent over the past 2-3 days that radiates into the back, worse after eating.. Denies N/V/Diaphoresis or SOB. Pt states she believes it is related to anxiety but her therapist does not believe so.

## 2015-02-04 NOTE — H&P (Signed)
Stacie Keller is an 34 y.o. female.   Chief Complaint: Abdominal pain HPI: 34yrold with PMHx of Obesity, HTN, DM, Hypothyroidism comes in complaining of intermittent abdominal pain.  Patient states this pain originally started about 3-4 months prior and was epigastric to RUQ pain.  She noticed it would come on with eating fatty foods, She tried reflux medications with resolution.  She states that over the past few weeks the episodes have gotten more frequent and intense.  She has had nausea but no vomiting, increased flatulence and diarrhea along with the pain.  She states she started hurting today about 10am which started at a 4 of 10 and then intensified to 6 of 10 until she came to the ED.  She denies any fever, chills, vomiting, constipation, jaundice, melena, dysuria or hematuria.   Past Medical History  Diagnosis Date  . Anxiety   . Asthma   . Thyroid disease   . Depression   . Diabetes mellitus without complication ([facility]     Past Surgical History  Procedure Laterality Date  . Appendectomy    . Cesarean section      x2  . Eye surgery      Family History  Problem Relation Age of Onset  . Asthma Mother   . COPD Mother   . Heart disease Mother   . Hypertension Mother    Social History:  reports that she has never smoked. She has never used smokeless tobacco. She reports that she does not drink alcohol or use illicit drugs.  Allergies:  Allergies  Allergen Reactions  . Latex Hives and Itching    swelling     (Not in a hospital admission)  Results for orders placed or performed during the hospital encounter of 02/04/15 (from the past 48 hour(s))  Lipase, blood     Status: None   Collection Time: 02/04/15  6:31 PM  Result Value Ref Range   Lipase 25 11 - 51 U/L  Comprehensive metabolic panel     Status: Abnormal   Collection Time: 02/04/15  6:31 PM  Result Value Ref Range   Sodium 135 135 - 145 mmol/L   Potassium 3.7 3.5 - 5.1 mmol/L   Chloride 105 101 - 111 mmol/L    CO2 24 22 - 32 mmol/L   Glucose, Bld 137 (H) 65 - 99 mg/dL   BUN 9 6 - 20 mg/dL   Creatinine, Ser 0.92 0.44 - 1.00 mg/dL   Calcium 9.1 8.9 - 10.3 mg/dL   Total Protein 7.1 6.5 - 8.1 g/dL   Albumin 3.7 3.5 - 5.0 g/dL   AST 31 15 - 41 U/L   ALT 26 14 - 54 U/L   Alkaline Phosphatase 57 38 - 126 U/L   Total Bilirubin 0.7 0.3 - 1.2 mg/dL   GFR calc non Af Amer >60 >60 mL/min   GFR calc Af Amer >60 >60 mL/min    Comment: (NOTE) The eGFR has been calculated using the CKD EPI equation. This calculation has not been validated in all clinical situations. eGFR's persistently <60 mL/min signify possible Chronic Kidney Disease.    Anion gap 6 5 - 15  CBC     Status: Abnormal   Collection Time: 02/04/15  6:31 PM  Result Value Ref Range   WBC 10.5 3.6 - 11.0 K/uL   RBC 5.10 3.80 - 5.20 MIL/uL   Hemoglobin 12.9 12.0 - 16.0 g/dL   HCT 38.5 35.0 - 47.0 %   MCV 75.6 (L)  80.0 - 100.0 fL   MCH 25.3 (L) 26.0 - 34.0 pg   MCHC 33.4 32.0 - 36.0 g/dL   RDW 15.3 (H) 11.5 - 14.5 %   Platelets 268 150 - 440 K/uL  Urinalysis complete, with microscopic ([facility] only)     Status: Abnormal   Collection Time: 02/04/15  6:31 PM  Result Value Ref Range   Color, Urine YELLOW (A) YELLOW   APPearance HAZY (A) CLEAR   Glucose, UA NEGATIVE NEGATIVE mg/dL   Bilirubin Urine NEGATIVE NEGATIVE   Ketones, ur NEGATIVE NEGATIVE mg/dL   Specific Gravity, Urine 1.014 1.005 - 1.030   Hgb urine dipstick NEGATIVE NEGATIVE   pH 5.0 5.0 - 8.0   Protein, ur NEGATIVE NEGATIVE mg/dL   Nitrite NEGATIVE NEGATIVE   Leukocytes, UA NEGATIVE NEGATIVE   RBC / HPF NONE SEEN 0 - 5 RBC/hpf   WBC, UA 0-5 0 - 5 WBC/hpf   Bacteria, UA NONE SEEN NONE SEEN   Squamous Epithelial / LPF 6-30 (A) NONE SEEN   Mucous PRESENT   Pregnancy, urine POC     Status: None   Collection Time: 02/04/15  6:42 PM  Result Value Ref Range   Preg Test, Ur NEGATIVE NEGATIVE    Comment:        THE SENSITIVITY OF THIS METHODOLOGY IS >24 mIU/mL    US  Abdomen Limited Ruq  02/04/2015  CLINICAL DATA:  Epigastric abdominal pain, worse after eating. EXAM: US ABDOMEN LIMITED - RIGHT UPPER QUADRANT COMPARISON:  None available FINDINGS: Gallbladder: There is a shadowing echogenic focus within the gallbladder measuring 2.7 cm which likely represents a gallstone. No definite mobility was seen with change of positioning. There is borderline gallbladder wall thickening, with maximum diameter of 2.6 mm. No pericholecystic fluid is seen. Positive sonographic Murphy's sign was noted. Common bile duct: Diameter: 3 mm. Liver: Diffusely increased echogenicity of the liver was noted. IMPRESSION: Cholelithiasis with signs of questionable acute cholecystitis. Please correlate clinically. Diffusely increased echogenicity of the liver which may be seen with hepatic steatosis. These results were called by telephone at the time of interpretation on 02/04/2015 at 9:03 pm to Dr. Charlotte Crumb , who verbally acknowledged these results. Electronically Signed   By: Fidela Salisbury M.D.   On: 02/04/2015 21:06    Review of Systems  Constitutional: Negative for fever, chills, weight loss, malaise/fatigue and diaphoresis.  HENT: Negative for sore throat.   Respiratory: Negative for cough, shortness of breath and wheezing.   Cardiovascular: Negative for chest pain, palpitations and leg swelling.  Gastrointestinal: Positive for heartburn, nausea, abdominal pain and diarrhea. Negative for vomiting, constipation, blood in stool and melena.  Genitourinary: Negative for dysuria, urgency, frequency, hematuria and flank pain.  Musculoskeletal: Negative for myalgias, back pain, joint pain and neck pain.  Skin: Negative for itching and rash.  Neurological: Negative for dizziness, loss of consciousness, weakness and headaches.  Psychiatric/Behavioral: Negative for depression. The patient is not nervous/anxious.   All other systems reviewed and are negative.   Blood pressure 146/68,  pulse 92, temperature 98.1 F (36.7 C), temperature source Oral, resp. rate 18, height _0  (1.702 m), weight 255 lb (115.667 kg), last menstrual period 01/12/2015, SpO2 98 %. Physical Exam  Vitals reviewed. Constitutional: She is oriented to person, place, and time. She appears well-developed and well-nourished. No distress.  HENT:  Head: Normocephalic and atraumatic.  Right Ear: External ear normal.  Left Ear: External ear normal.  Nose: Nose normal.  Mouth/Throat: Oropharynx is  clear and moist. No oropharyngeal exudate.  Eyes: Conjunctivae and EOM are normal. No scleral icterus.  Neck: Normal range of motion. Neck supple. No tracheal deviation present. No thyromegaly present.  Cardiovascular: Normal rate, regular rhythm, normal heart sounds and intact distal pulses.  Exam reveals no gallop and no friction rub.   No murmur heard. Respiratory: Effort normal and breath sounds normal. No respiratory distress. She has no wheezes. She has no rales.  GI: Soft. Bowel sounds are normal. She exhibits no distension. There is tenderness. There is no rebound and no guarding.  Epigastric and RUQ tenderness, NEGATIVE murphy's sign  Musculoskeletal: Normal range of motion. She exhibits no edema or tenderness.  Neurological: She is alert and oriented to person, place, and time. No cranial nerve deficit.  Skin: Skin is warm and dry. No rash noted. No erythema. No pallor.  Psychiatric: She has a normal mood and affect. Her behavior is normal. Judgment and thought content normal.     Assessment/Plan 34yrold female with symptomatic cholelithiasis.  I have personally reviewed her records from [facility] including [place]  I have reviewed her laboratory values with normal WBC and Liver function studies.  I have personally reviewed the U/s images showing a large stone in the gallbladder without any wall thickening or fluid, normal CBD.  I have also reviewed the radiology read as above. I discussed with  the patient that given her normal labs and unconcerning imaging studies that she needed her gallbladder removed at some point but didn't have to be emergent.  Patient stated with her current pain and nausea she did not think she could go home and opted to be admitted and have her gallbladder out tomorrow.  The risks, benefits, complications, treatment options, and expected outcomes were discussed with the patient. The possibilities of bleeding, recurrent infection, finding a normal gallbladder, perforation of viscus organs, damage to surrounding structures, bile leak, abscess formation, needing a drain placed, the need for additional procedures, reaction to medication, pulmonary aspiration,  failure to diagnose a condition, the possible need to convert to an open procedure, and creating a complication requiring transfusion or operation were discussed with the patient. The patient and concurred with the proposed plan, giving informed consent.   CLavona MoundLoflin 02/04/2015, 10:11 PM

## 2015-02-04 NOTE — ED Notes (Signed)
Pt reports epigastric pain radiating to her back starting "a few months ago" Pt in no acute distress

## 2015-02-04 NOTE — ED Provider Notes (Addendum)
[facility] Emergency Department Provider Note  ____________________________________________   I have reviewed the triage vital signs and the nursing notes.   HISTORY  Chief Complaint Chest Pain and Abdominal Pain    HPI Stacie Keller is a 34 y.o. female with a history of epigastric pain worse with food for several months. Denies any fever chills or vomiting. No hematemesis no bright red blood per rectum. No lower abdominal pain. No dysuria no urinary frequency. It's a burning discomfort.Pain seems to be worse over the last few days. No shortness of breath no chest pain positive nausea no vomiting. When she stated "chest pain" to triage she was indicating her epigastric region and right upper quadrant. No history of abdominal surgery she states.  Past Medical History  Diagnosis Date  . Anxiety   . Asthma   . Thyroid disease     There are no active problems to display for this patient.   Past Surgical History  Procedure Laterality Date  . Appendectomy    . Cesarean section      x2  . Eye surgery      Current Outpatient Rx  Name  Route  Sig  Dispense  Refill  . cyclobenzaprine (FLEXERIL) 10 MG tablet   Oral   Take 1 tablet (10 mg total) by mouth every 8 (eight) hours as needed for muscle spasms.   15 tablet   0   . ibuprofen (ADVIL,MOTRIN) 800 MG tablet   Oral   Take 1 tablet (800 mg total) by mouth every 8 (eight) hours as needed for moderate pain.   15 tablet   0   . traMADol (ULTRAM) 50 MG tablet   Oral   Take 1 tablet (50 mg total) by mouth every 6 (six) hours as needed.   20 tablet   0     Allergies Hydrocodone  No family history on file.  Social History Social History  Substance Use Topics  . Smoking status: Never Smoker   . Smokeless tobacco: None  . Alcohol Use: No    Review of Systems }Constitutional: No fever/chills Eyes: No visual changes. ENT: No sore throat. No stiff neck no neck pain Cardiovascular: Denies  chest pain. Respiratory: Denies shortness of breath. Gastrointestinal:   no vomiting.  No diarrhea.  No constipation. Genitourinary: Negative for dysuria. Musculoskeletal: Negative lower extremity swelling Skin: Negative for rash. Neurological: Negative for headaches, focal weakness or numbness. 10-point ROS otherwise negative.  ____________________________________________   PHYSICAL EXAM:  VITAL SIGNS: ED Triage Vitals  Enc Vitals Group     BP 02/04/15 1828 146/68 mmHg     Pulse Rate 02/04/15 1828 92     Resp 02/04/15 1828 18     Temp 02/04/15 1828 98.1 F (36.7 C)     Temp Source 02/04/15 1828 Oral     SpO2 02/04/15 1828 98 %     Weight 02/04/15 1828 255 lb (115.667 kg)     Height 02/04/15 1828 5\' 7"  (1.702 m)     Head Cir --      Peak Flow --      Pain Score 02/04/15 1828 4     Pain Loc --      Pain Edu? --      Excl. in GC? --     Constitutional: Alert and oriented. Well appearing and in no acute distress. Eyes: Conjunctivae are normal. PERRL. EOMI. Head: Atraumatic. Nose: No congestion/rhinnorhea. Mouth/Throat: Mucous membranes are moist.  Oropharynx non-erythematous. Neck: No stridor.  Nontender with no meningismus Cardiovascular: Normal rate, regular rhythm. Grossly normal heart sounds.  Good peripheral circulation. Respiratory: Normal respiratory effort.  No retractions. Lungs CTAB. Abdominal: Morbid obesity noted, there is epigastric discomfort and minimal right upper quadrant pain, voluntary guarding but no rebound abdomen is soft otherwise benign exam Back:  There is no focal tenderness or step off there is no midline tenderness there are no lesions noted. there is no CVA tenderness Musculoskeletal: No lower extremity tenderness. No joint effusions, no DVT signs strong distal pulses no edema Neurologic:  Normal speech and language. No gross focal neurologic deficits are appreciated.  Skin:  Skin is warm, dry and intact. No rash noted. Psychiatric: Mood and  affect are normal. Speech and behavior are normal.  ____________________________________________   LABS (all labs ordered are listed, but only abnormal results are displayed)  Labs Reviewed  COMPREHENSIVE METABOLIC PANEL - Abnormal; Notable for the following:    Glucose, Bld 137 (*)    All other components within normal limits  CBC - Abnormal; Notable for the following:    MCV 75.6 (*)    MCH 25.3 (*)    RDW 15.3 (*)    All other components within normal limits  URINALYSIS COMPLETEWITH MICROSCOPIC ([facility] ONLY) - Abnormal; Notable for the following:    Color, Urine YELLOW (*)    APPearance HAZY (*)    Squamous Epithelial / LPF 6-30 (*)    All other components within normal limits  LIPASE, BLOOD  POC URINE PREG, ED  POCT PREGNANCY, URINE   ____________________________________________  EKG  I personally interpreted any EKGs ordered by me or triage Normal sinus rhythm rate 82 bpm no acute ST elevation or acute ST depression nonspecific ST changes no acute ischemia ____________________________________________  RADIOLOGY  I reviewed any imaging ordered by me or triage that were performed during my shift ____________________________________________   PROCEDURES  Procedure(s) performed: None  Critical Care performed: None  ____________________________________________   INITIAL IMPRESSION / ASSESSMENT AND PLAN / ED COURSE  Pertinent labs & imaging results that were available during my care of the patient were reviewed by me and considered in my medical decision making (see chart for details).  Liver function tests are reassuring white count is reassuring and just however informed by radiology that there is a large stone which is nonmobile with borderline thickened gallbladder wall. We'll discuss with surgery.   ----------------------------------------- 9:23 PM on 02/04/2015 -----------------------------------------   discussed with on-call surgeon that will come  evaluate patient ____________________________________________   FINAL CLINICAL IMPRESSION(S) / ED DIAGNOSES  Final diagnoses:  Abdominal pain     Jeanmarie Plant, MD 02/04/15 2105  Jeanmarie Plant, MD 02/04/15 2119  Jeanmarie Plant, MD 02/04/15 2123  Jeanmarie Plant, MD 02/04/15 2207

## 2015-02-05 ENCOUNTER — Encounter
Admission: EM | Disposition: A | Discharge status: Home / Self Care | Payer: Self-pay | Source: Home / Self Care | Attending: Emergency Medicine

## 2015-02-05 ENCOUNTER — Observation Stay: Payer: Medicaid Other | Admitting: Certified Registered Nurse Anesthetist

## 2015-02-05 ENCOUNTER — Encounter: Payer: Self-pay | Admitting: *Deleted

## 2015-02-05 DIAGNOSIS — K8050 Calculus of bile duct without cholangitis or cholecystitis without obstruction: Secondary | ICD-10-CM | POA: Diagnosis not present

## 2015-02-05 HISTORY — PX: CHOLECYSTECTOMY: SHX55

## 2015-02-05 LAB — PREGNANCY, URINE: PREG TEST UR: NEGATIVE

## 2015-02-05 LAB — GLUCOSE, CAPILLARY: Glucose-Capillary: 143 mg/dL — ABNORMAL HIGH (ref 65–99)

## 2015-02-05 SURGERY — LAPAROSCOPIC CHOLECYSTECTOMY WITH INTRAOPERATIVE CHOLANGIOGRAM
Anesthesia: General

## 2015-02-05 MED ORDER — ENOXAPARIN SODIUM 40 MG/0.4ML ~~LOC~~ SOLN: 40.0000 mg | SUBCUTANEOUS | Status: DC

## 2015-02-05 MED ORDER — LIDOCAINE HCL (CARDIAC) 20 MG/ML IV SOLN
INTRAVENOUS | Status: DC | PRN
  Administered 2015-02-05: 100 mg via INTRAVENOUS

## 2015-02-05 MED ORDER — LEVOTHYROXINE SODIUM 50 MCG PO TABS
175.0000 ug | ORAL_TABLET | Freq: Every day | ORAL | Status: DC
  Administered 2015-02-06: 175 ug via ORAL
  Filled 2015-02-05: qty 1

## 2015-02-05 MED ORDER — SODIUM CHLORIDE 0.9 % IJ SOLN
INTRAMUSCULAR | Status: AC
  Filled 2015-02-05: qty 10

## 2015-02-05 MED ORDER — FLUTICASONE PROPIONATE 50 MCG/ACT NA SUSP
2.0000 | Freq: Every day | NASAL | Status: DC
  Filled 2015-02-05: qty 16

## 2015-02-05 MED ORDER — PROPOFOL 10 MG/ML IV BOLUS
INTRAVENOUS | Status: DC | PRN
  Administered 2015-02-05: 20 mg via INTRAVENOUS
  Administered 2015-02-05: 180 mg via INTRAVENOUS

## 2015-02-05 MED ORDER — FENTANYL CITRATE (PF) 100 MCG/2ML IJ SOLN
INTRAMUSCULAR | Status: DC | PRN
  Administered 2015-02-05 (×5): 50 ug via INTRAVENOUS

## 2015-02-05 MED ORDER — LABETALOL HCL 200 MG PO TABS: 200.0000 mg | ORAL_TABLET | Freq: Once | ORAL | Status: DC

## 2015-02-05 MED ORDER — FENTANYL CITRATE (PF) 100 MCG/2ML IJ SOLN
INTRAMUSCULAR | Status: AC
  Administered 2015-02-05: 50 ug via INTRAVENOUS
  Filled 2015-02-05: qty 2

## 2015-02-05 MED ORDER — SUGAMMADEX SODIUM 500 MG/5ML IV SOLN
INTRAVENOUS | Status: DC | PRN
  Administered 2015-02-05: 231.4 mg via INTRAVENOUS

## 2015-02-05 MED ORDER — DIPHENHYDRAMINE HCL 50 MG/ML IJ SOLN
6.2500 mg | Freq: Once | INTRAMUSCULAR | Status: AC
  Administered 2015-02-05: 6.5 mg via INTRAVENOUS

## 2015-02-05 MED ORDER — FENTANYL CITRATE (PF) 100 MCG/2ML IJ SOLN
25.0000 ug | INTRAMUSCULAR | Status: DC | PRN
  Administered 2015-02-05: 25 ug via INTRAVENOUS
  Administered 2015-02-05: 50 ug via INTRAVENOUS
  Administered 2015-02-05: 25 ug via INTRAVENOUS

## 2015-02-05 MED ORDER — LORATADINE 10 MG PO TABS: 10.0000 mg | ORAL_TABLET | Freq: Every day | ORAL | Status: DC

## 2015-02-05 MED ORDER — PROMETHAZINE HCL 25 MG/ML IJ SOLN
INTRAMUSCULAR | Status: AC
  Administered 2015-02-05: 12.5 mg via INTRAVENOUS
  Filled 2015-02-05: qty 1

## 2015-02-05 MED ORDER — MORPHINE SULFATE (PF) 2 MG/ML IV SOLN
2.0000 mg | INTRAVENOUS | Status: DC | PRN
  Administered 2015-02-05 – 2015-02-06 (×6): 2 mg via INTRAVENOUS
  Filled 2015-02-05 (×6): qty 1

## 2015-02-05 MED ORDER — DIPHENHYDRAMINE HCL 50 MG/ML IJ SOLN
INTRAMUSCULAR | Status: AC
  Administered 2015-02-05: 6.5 mg via INTRAVENOUS
  Filled 2015-02-05: qty 1

## 2015-02-05 MED ORDER — DEXAMETHASONE SODIUM PHOSPHATE 4 MG/ML IJ SOLN
INTRAMUSCULAR | Status: DC | PRN
  Administered 2015-02-05: 4 mg via INTRAVENOUS

## 2015-02-05 MED ORDER — OXYCODONE HCL 5 MG PO TABS: 5.0000 mg | ORAL_TABLET | Freq: Once | ORAL | Status: DC | PRN

## 2015-02-05 MED ORDER — LACTATED RINGERS IV SOLN
INTRAVENOUS | Status: DC
  Administered 2015-02-05 – 2015-02-06 (×4): via INTRAVENOUS

## 2015-02-05 MED ORDER — MIDAZOLAM HCL 2 MG/2ML IJ SOLN
INTRAMUSCULAR | Status: DC | PRN
Start: 2015-02-05 — End: 2015-02-05
  Administered 2015-02-05: 2 mg via INTRAVENOUS

## 2015-02-05 MED ORDER — DIPHENHYDRAMINE HCL 50 MG/ML IJ SOLN
12.5000 mg | Freq: Four times a day (QID) | INTRAMUSCULAR | Status: DC | PRN
  Administered 2015-02-05: 12.5 mg via INTRAVENOUS
  Filled 2015-02-05: qty 1

## 2015-02-05 MED ORDER — DEXTROSE 5 % IV SOLN
2.0000 g | Freq: Two times a day (BID) | INTRAVENOUS | Status: DC
  Administered 2015-02-05 – 2015-02-06 (×3): 2 g via INTRAVENOUS
  Filled 2015-02-05 (×5): qty 2

## 2015-02-05 MED ORDER — ONDANSETRON HCL 4 MG/2ML IJ SOLN
4.0000 mg | Freq: Four times a day (QID) | INTRAMUSCULAR | Status: DC | PRN
  Administered 2015-02-05: 4 mg via INTRAVENOUS
  Filled 2015-02-05: qty 2

## 2015-02-05 MED ORDER — HYDRALAZINE HCL 20 MG/ML IJ SOLN: 10.0000 mg | INTRAMUSCULAR | Status: DC | PRN

## 2015-02-05 MED ORDER — SUCCINYLCHOLINE CHLORIDE 20 MG/ML IJ SOLN
INTRAMUSCULAR | Status: DC | PRN
  Administered 2015-02-05: 120 mg via INTRAVENOUS

## 2015-02-05 MED ORDER — TOPIRAMATE 25 MG PO TABS
50.0000 mg | ORAL_TABLET | Freq: Every day | ORAL | Status: DC
  Filled 2015-02-05: qty 2

## 2015-02-05 MED ORDER — IPRATROPIUM-ALBUTEROL 0.5-2.5 (3) MG/3ML IN SOLN: 3.0000 mL | Freq: Four times a day (QID) | RESPIRATORY_TRACT | Status: DC | PRN

## 2015-02-05 MED ORDER — BUPIVACAINE-EPINEPHRINE (PF) 0.25% -1:200000 IJ SOLN
INTRAMUSCULAR | Status: DC | PRN
  Administered 2015-02-05: 30 mL via PERINEURAL

## 2015-02-05 MED ORDER — PANTOPRAZOLE SODIUM 40 MG IV SOLR
40.0000 mg | Freq: Every day | INTRAVENOUS | Status: DC
Start: 2015-02-05 — End: 2015-02-06
  Administered 2015-02-05: 40 mg via INTRAVENOUS
  Filled 2015-02-05: qty 40

## 2015-02-05 MED ORDER — ONDANSETRON HCL 4 MG/2ML IJ SOLN
INTRAMUSCULAR | Status: DC | PRN
  Administered 2015-02-05: 4 mg via INTRAVENOUS

## 2015-02-05 MED ORDER — DIPHENHYDRAMINE HCL 12.5 MG/5ML PO ELIX: 12.5000 mg | ORAL_SOLUTION | Freq: Four times a day (QID) | ORAL | Status: DC | PRN

## 2015-02-05 MED ORDER — MONTELUKAST SODIUM 10 MG PO TABS
10.0000 mg | ORAL_TABLET | Freq: Every day | ORAL | Status: DC
  Administered 2015-02-05: 10 mg via ORAL
  Filled 2015-02-05: qty 1

## 2015-02-05 MED ORDER — KETOROLAC TROMETHAMINE 30 MG/ML IJ SOLN
30.0000 mg | Freq: Four times a day (QID) | INTRAMUSCULAR | Status: DC
  Filled 2015-02-05: qty 1

## 2015-02-05 MED ORDER — PROMETHAZINE HCL 25 MG/ML IJ SOLN
12.5000 mg | Freq: Once | INTRAMUSCULAR | Status: AC
  Administered 2015-02-05: 12.5 mg via INTRAVENOUS

## 2015-02-05 MED ORDER — FLUOXETINE HCL 20 MG PO CAPS
20.0000 mg | ORAL_CAPSULE | Freq: Every day | ORAL | Status: DC
  Filled 2015-02-05: qty 1

## 2015-02-05 MED ORDER — LEVOTHYROXINE SODIUM 50 MCG PO TABS: 175.0000 ug | ORAL_TABLET | Freq: Every day | ORAL | Status: DC

## 2015-02-05 MED ORDER — ROCURONIUM BROMIDE 100 MG/10ML IV SOLN
INTRAVENOUS | Status: DC | PRN
  Administered 2015-02-05: 30 mg via INTRAVENOUS

## 2015-02-05 MED ORDER — BUPIVACAINE-EPINEPHRINE (PF) 0.25% -1:200000 IJ SOLN
INTRAMUSCULAR | Status: AC
  Filled 2015-02-05: qty 30

## 2015-02-05 MED ORDER — ONDANSETRON 4 MG PO TBDP: 4.0000 mg | ORAL_TABLET | Freq: Four times a day (QID) | ORAL | Status: DC | PRN

## 2015-02-05 MED ORDER — OXYCODONE HCL 5 MG/5ML PO SOLN: 5.0000 mg | Freq: Once | ORAL | Status: DC | PRN

## 2015-02-05 MED ORDER — LORATADINE 10 MG PO TABS
10.0000 mg | ORAL_TABLET | Freq: Every day | ORAL | Status: DC
  Administered 2015-02-06: 10 mg via ORAL
  Filled 2015-02-05: qty 1

## 2015-02-05 SURGICAL SUPPLY — 44 items
ADH LQ OCL WTPRF AMP STRL LF (MISCELLANEOUS) ×1
ADHESIVE MASTISOL STRL (MISCELLANEOUS) ×3 IMPLANT
APPLIER CLIP ROT 10 11.4 M/L (STAPLE) ×3
APR CLP MED LRG 11.4X10 (STAPLE) ×1
BLADE SURG SZ11 CARB STEEL (BLADE) ×3 IMPLANT
CANISTER SUCT 1200ML W/VALVE (MISCELLANEOUS) ×3 IMPLANT
CATH CHOLANGI 4FR 420404F (CATHETERS) ×3 IMPLANT
CHLORAPREP W/TINT 26ML (MISCELLANEOUS) ×3 IMPLANT
CLIP APPLIE ROT 10 11.4 M/L (STAPLE) ×1 IMPLANT
CLOSURE WOUND 1/2 X4 (GAUZE/BANDAGES/DRESSINGS) ×1
CONRAY 60ML FOR OR (MISCELLANEOUS) ×3 IMPLANT
DRAPE C-ARM XRAY 36X54 (DRAPES) ×3 IMPLANT
ENDOPOUCH RETRIEVER 10 (MISCELLANEOUS) ×3 IMPLANT
GAUZE SPONGE NON-WVN 2X2 STRL (MISCELLANEOUS) ×4 IMPLANT
GLOVE BIO SURGEON STRL SZ8 (GLOVE) ×3 IMPLANT
GOWN STRL REUS W/ TWL LRG LVL3 (GOWN DISPOSABLE) ×4 IMPLANT
GOWN STRL REUS W/TWL LRG LVL3 (GOWN DISPOSABLE) ×12
IRRIGATION STRYKERFLOW (MISCELLANEOUS) ×1 IMPLANT
IRRIGATOR STRYKERFLOW (MISCELLANEOUS) ×3
IV CATH ANGIO 12GX3 LT BLUE (NEEDLE) ×3 IMPLANT
IV NS 1000ML (IV SOLUTION) ×3
IV NS 1000ML BAXH (IV SOLUTION) ×1 IMPLANT
JACKSON PRATT 10 (INSTRUMENTS) ×3 IMPLANT
KIT RM TURNOVER STRD PROC AR (KITS) ×3 IMPLANT
LABEL OR SOLS (LABEL) ×3 IMPLANT
NDL SAFETY 22GX1.5 (NEEDLE) ×3 IMPLANT
NEEDLE VERESS 14GA 120MM (NEEDLE) ×3 IMPLANT
NS IRRIG 500ML POUR BTL (IV SOLUTION) ×3 IMPLANT
PACK LAP CHOLECYSTECTOMY (MISCELLANEOUS) ×3 IMPLANT
PAD GROUND ADULT SPLIT (MISCELLANEOUS) ×3 IMPLANT
SCISSORS METZENBAUM CVD 33 (INSTRUMENTS) ×3 IMPLANT
SLEEVE ENDOPATH XCEL 5M (ENDOMECHANICALS) ×3 IMPLANT
SPONGE EXCIL AMD DRAIN 4X4 6P (MISCELLANEOUS) ×3 IMPLANT
SPONGE LAP 18X18 5 PK (GAUZE/BANDAGES/DRESSINGS) ×3 IMPLANT
SPONGE VERSALON 2X2 STRL (MISCELLANEOUS) ×12
STRIP CLOSURE SKIN 1/2X4 (GAUZE/BANDAGES/DRESSINGS) ×2 IMPLANT
SUT MNCRL 4-0 (SUTURE) ×3
SUT MNCRL 4-0 27XMFL (SUTURE) ×1
SUT VICRYL 0 AB UR-6 (SUTURE) ×3 IMPLANT
SUTURE MNCRL 4-0 27XMF (SUTURE) ×1 IMPLANT
SYR 20CC LL (SYRINGE) ×3 IMPLANT
TROCAR XCEL NON-BLD 11X100MML (ENDOMECHANICALS) ×3 IMPLANT
TROCAR XCEL NON-BLD 5MMX100MML (ENDOMECHANICALS) ×6 IMPLANT
TUBING INSUFFLATOR HI FLOW (MISCELLANEOUS) ×3 IMPLANT

## 2015-02-05 NOTE — Transfer of Care (Signed)
Immediate Anesthesia Transfer of Care Note  Patient: Stacie Keller  Procedure(s) Performed: Procedure(s): LAPAROSCOPIC CHOLECYSTECTOMY WITH INTRAOPERATIVE CHOLANGIOGRAM (N/A)  Patient Location: PACU  Anesthesia Type:General  Level of Consciousness: awake, alert  and oriented  Airway & Oxygen Therapy: Patient Spontanous Breathing and Patient connected to face mask oxygen  Post-op Assessment: Report given to RN and Post -op Vital signs reviewed and stable  Post vital signs: Reviewed and stable  Last Vitals:  Filed Vitals:   02/05/15 0851 02/05/15 1049  BP: 115/75 156/88  Pulse: 73 97  Temp: 36.9 C 37.1 C  Resp: 18 32    Complications: No apparent anesthesia complications

## 2015-02-05 NOTE — Anesthesia Postprocedure Evaluation (Signed)
Anesthesia Post Note  Patient: Stacie Keller  Procedure(s) Performed: Procedure(s) (LRB): LAPAROSCOPIC CHOLECYSTECTOMY WITH INTRAOPERATIVE CHOLANGIOGRAM (N/A)  Patient location during evaluation: PACU Anesthesia Type: General Level of consciousness: awake and alert Pain management: pain level controlled Vital Signs Assessment: post-procedure vital signs reviewed and stable Respiratory status: spontaneous breathing, nonlabored ventilation, respiratory function stable and patient connected to nasal cannula oxygen Cardiovascular status: blood pressure returned to baseline and stable Postop Assessment: no signs of nausea or vomiting Anesthetic complications: no    Last Vitals:  Filed Vitals:   02/05/15 1200 02/05/15 1241  BP:  124/80  Pulse: 76 74  Temp: 36.7 C 36.8 C  Resp: 15 16    Last Pain:  Filed Vitals:   02/05/15 1241  PainSc: 0-No pain                 Cleda MccreedyJoseph K Piscitello

## 2015-02-05 NOTE — ED Notes (Signed)
[facility] pt [facility]

## 2015-02-05 NOTE — Anesthesia Preprocedure Evaluation (Signed)
Anesthesia Evaluation  Patient identified by MRN, date of birth, ID band Patient awake    Reviewed: Allergy & Precautions, H&P , NPO status , Patient's Chart, lab work & pertinent test results  History of Anesthesia Complications Negative for: history of anesthetic complications  Airway Mallampati: III  TM Distance: <3 FB Neck ROM: full    Dental  (+) Poor Dentition, Chipped   Pulmonary neg shortness of breath, asthma ,    Pulmonary exam normal breath sounds clear to auscultation       Cardiovascular Exercise Tolerance: Good hypertension, (-) angina+ DOE  (-) Past MI Normal cardiovascular exam Rhythm:regular Rate:Normal     Neuro/Psych  Headaches, PSYCHIATRIC DISORDERS Anxiety Depression negative psych ROS   GI/Hepatic Neg liver ROS, GERD  Controlled,  Endo/Other  diabetes, Type 2Hypothyroidism   Renal/GU negative Renal ROS  negative genitourinary   Musculoskeletal   Abdominal   Peds  Hematology negative hematology ROS (+)   Anesthesia Other Findings Past Medical History:   Anxiety                                                      Asthma                                                       Thyroid disease                                              Depression                                                   Diabetes mellitus without complication (HCC)                Past Surgical History:   APPENDECTOMY                                                  CESAREAN SECTION                                                Comment:x2   EYE SURGERY                                                  BMI    Body Mass Index   39.92 kg/m 2      Reproductive/Obstetrics negative OB ROS  Anesthesia Physical Anesthesia Plan  ASA: III  Anesthesia Plan: General ETT   Post-op Pain Management:    Induction:   Airway Management Planned:   Additional  Equipment:   Intra-op Plan:   Post-operative Plan:   Informed Consent: I have reviewed the patients History and Physical, chart, labs and discussed the procedure including the risks, benefits and alternatives for the proposed anesthesia with the patient or authorized representative who has indicated his/her understanding and acceptance.   Dental Advisory Given  Plan Discussed with: Anesthesiologist, CRNA and Surgeon  Anesthesia Plan Comments:         Anesthesia Quick Evaluation

## 2015-02-05 NOTE — Plan of Care (Addendum)
Report given to pre-op, SCD placed signed consent and gave CHG bath. Pt going to OR to remove gallbladder about 0900.

## 2015-02-05 NOTE — Progress Notes (Signed)
Preoperative Review   Patient is met in the preoperative holding area. The history is reviewed in the chart and with the patient. I personally reviewed the options and rationale as well as the risks of this procedure that have been previously discussed with the patient. All questions asked by the patient and/or family were answered to their satisfaction.  Patient agrees to proceed with this procedure at this time.  Richard E Cooper M.D. FACS  

## 2015-02-05 NOTE — Op Note (Signed)
Laparoscopic Cholecystectomy  Pre-operative Diagnosis: Symptomatically cholelithiasis  Post-operative Diagnosis: Symptomatic cholelithiasis  Procedure: Laparoscopic cholecystectomy  Surgeon: Adah Salvage. Excell Seltzer, MD FACS  Anesthesia: Gen. with endotracheal tube  Assistant: Surgical tech  Procedure Details  The patient was seen again in the Holding Room. The benefits, complications, treatment options, and expected outcomes were discussed with the patient. The risks of bleeding, infection, recurrence of symptoms, failure to resolve symptoms, bile duct damage, bile duct leak, retained common bile duct stone, bowel injury, any of which could require further surgery and/or ERCP, stent, or papillotomy were reviewed with the patient. The likelihood of improving the patient's symptoms with return to their baseline status is good.  The patient and/or family concurred with the proposed plan, giving informed consent.  The patient was taken to Operating Room, identified as [facility] and the procedure verified as Laparoscopic Cholecystectomy.  A Time Out was held and the above information confirmed.  Prior to the induction of general anesthesia, antibiotic prophylaxis was administered. VTE prophylaxis was in place. General endotracheal anesthesia was then administered and tolerated well. After the induction, the abdomen was prepped with Chloraprep and draped in the sterile fashion. The patient was positioned in the supine position. Her morbid obesity was taken into account.  Local anesthetic  was injected into the skin near the umbilicus and an incision made. The extra long Veress needle was placed. Pneumoperitoneum was then created with CO2 and tolerated well without any adverse changes in the patient's vital signs. A extra long 5mm port was placed in the periumbilical position and the abdominal cavity was explored.  Two 5-mm ports were placed in the right upper quadrant and a 12 mm epigastric port was  placed all under direct vision. All skin incisions  were infiltrated with a local anesthetic agent before making the incision and placing the trocars. The camera was placed in the epigastric site to be back at the periumbilical site there was no sign of bowel injury however there were some adhesions caudad to the port site with no bowel involvement.  The patient was positioned  in reverse Trendelenburg, tilted slightly to the patient's left.  The gallbladder was identified, the fundus grasped and retracted cephalad. Adhesions were lysed bluntly. The infundibulum was grasped and retracted laterally, exposing the peritoneum overlying the triangle of Calot. This was then divided and exposed in a blunt fashion. A critical view of the cystic duct and cystic artery was obtained.  The cystic duct was clearly identified and bluntly dissected.   Cystic lymphatics and cystic artery doubly clipped and divided. This allowed for good visualization of the cystic duct as it entered the infundibulum the cystic duct its self was very small. The cystic duct was then doubly clipped and divided.  The gallbladder was taken from the gallbladder fossa in a retrograde fashion with the electrocautery. The gallbladder was removed and placed in an Endocatch bag. The liver bed was irrigated and inspected. Hemostasis was achieved with the electrocautery. Copious irrigation was utilized and was repeatedly aspirated until clear.  The gallbladder and Endocatch sac were then removed through the epigastric port site.   Inspection of the gallbladder once it was ex vivo failed to identify the large 2.7 cm stone that was seen on ultrasound. The cystic duct was tiny and would not have accepted such a large stone. There was no spillage of stones during the case. At this point I believe the ultrasound was in error concerning size of the stones. No large stones were  palpable within the gallbladder at all.  Inspection of the right upper quadrant  was performed. No bleeding, bile duct injury or leak, or bowel injury was noted. Pneumoperitoneum was released.  The epigastric port site was closed with figure-of-eight 0 Vicryl sutures. 4-0 subcuticular Monocryl was used to close the skin. Steristrips and Mastisol and sterile dressings were  applied.  The patient was then extubated and brought to the recovery room in stable condition. Sponge, lap, and needle counts were correct at closure and at the conclusion of the case.   Findings: Chronic Cholecystitis   Estimated Blood Loss: Minimal         Drains: None         Specimens: Gallbladder           Complications: none               Zachariah Pavek E. Excell Seltzerooper, MD, FACS

## 2015-02-06 LAB — COMPREHENSIVE METABOLIC PANEL
ALBUMIN: 3.3 g/dL — AB (ref 3.5–5.0)
ALT: 29 U/L (ref 14–54)
AST: 40 U/L (ref 15–41)
Alkaline Phosphatase: 51 U/L (ref 38–126)
Anion gap: 8 (ref 5–15)
BUN: 7 mg/dL (ref 6–20)
CHLORIDE: 105 mmol/L (ref 101–111)
CO2: 23 mmol/L (ref 22–32)
Calcium: 8.9 mg/dL (ref 8.9–10.3)
Creatinine, Ser: 0.88 mg/dL (ref 0.44–1.00)
GFR calc Af Amer: >60 mL/min (ref >60)
GFR calc non Af Amer: >60 mL/min (ref >60)
GLUCOSE: 245 mg/dL — AB (ref 65–99)
POTASSIUM: 4 mmol/L (ref 3.5–5.1)
SODIUM: 136 mmol/L (ref 135–145)
Total Bilirubin: 0.4 mg/dL (ref 0.3–1.2)
Total Protein: 6.5 g/dL (ref 6.5–8.1)

## 2015-02-06 LAB — CBC WITH DIFFERENTIAL/PLATELET
BASOS ABS: 0 10*3/uL (ref 0–0.1)
BASOS PCT: 0 %
EOS ABS: 0 10*3/uL (ref 0–0.7)
EOS PCT: 0 %
HCT: 35.5 % (ref 35.0–47.0)
Hemoglobin: 12 g/dL (ref 12.0–16.0)
LYMPHS PCT: 11 %
Lymphs Abs: 1.6 10*3/uL (ref 1.0–3.6)
MCH: 25.5 pg — ABNORMAL LOW (ref 26.0–34.0)
MCHC: 33.8 g/dL (ref 32.0–36.0)
MCV: 75.4 fL — ABNORMAL LOW (ref 80.0–100.0)
MONO ABS: 0.6 10*3/uL (ref 0.2–0.9)
Monocytes Relative: 4 %
NEUTROS ABS: 13.1 10*3/uL — AB (ref 1.4–6.5)
NEUTROS PCT: 85 %
PLATELETS: 251 10*3/uL (ref 150–440)
RBC: 4.71 MIL/uL (ref 3.80–5.20)
RDW: 15.4 % — AB (ref 11.5–14.5)
WBC: 15.4 10*3/uL — ABNORMAL HIGH (ref 3.6–11.0)

## 2015-02-06 LAB — SURGICAL PATHOLOGY

## 2015-02-06 MED ORDER — OXYCODONE-ACETAMINOPHEN 5-325 MG PO TABS: 1.0000 | ORAL_TABLET | ORAL | Status: DC | PRN

## 2015-02-06 NOTE — Progress Notes (Signed)
02/06/2015 14:00  Stacie Keller to be D/C'd Home per MD order.  Discussed prescriptions and follow up appointments with the patient. Prescriptions given to patient, medication list explained in detail. Pt verbalized understanding.    Medication List    TAKE these medications        cetirizine 10 MG tablet  Commonly known as:  ZYRTEC  Take 10 mg by mouth daily.     COMBIVENT RESPIMAT 20-100 MCG/ACT Aers respimat  Generic drug:  Ipratropium-Albuterol  Inhale 1 puff into the lungs every 6 (six) hours as needed for wheezing or shortness of breath.     labetalol 200 MG tablet  Commonly known as:  NORMODYNE  Take 200 mg by mouth daily.     oxyCODONE-acetaminophen 5-325 MG tablet  Commonly known as:  ROXICET  Take 1 tablet by mouth every 4 (four) hours as needed for moderate pain.     SYNTHROID 175 MCG tablet  Generic drug:  levothyroxine  Take 175 mcg by mouth daily.        Filed Vitals:   02/06/15 0505 02/06/15 0828  BP: 113/62 127/70  Pulse: 67 87  Temp: 98.2 F (36.8 C) 98.4 F (36.9 C)  Resp: 17 16    Skin clean, dry and intact without evidence of skin break down, no evidence of skin tears noted. IV catheter discontinued intact. Site without signs and symptoms of complications. Dressing and pressure applied. Pt denies pain at this time. No complaints noted.  An After Visit Summary was printed and given to the patient. Patient escorted via WC, and D/C home via private auto.  Bradly Chrisougherty, Daniyal Tabor E

## 2015-02-06 NOTE — Discharge Summary (Signed)
Physician Discharge Summary  Patient ID: Stacie Keller MRN: [id number] DOB/AGE: 04/17/1980 34 y.o.  Admit date: 02/04/2015 Discharge date: 02/06/2015   Discharge Diagnoses:  Principal Problem:   Symptomatic cholelithiasis   Procedures: Laparoscopic cholecystectomy  Hospital Course: Patient expressed right upper quadrant pain and workup showing gallstones and with a positive Murphy sign on ultrasound this was consistent with her history. She was taken the operating room where no stones were identified certainly none of the large size noted on ultrasound but postoperatively she did well her pain was resolved and she is discharged in stable condition tolerating a regular diet to follow-up in 10 days she is given oral analgesics for pain and instructions to shower and wound care.  Consults: None  Disposition: 01-Home or Self Care     Medication List    TAKE these medications        cetirizine 10 MG tablet  Commonly known as:  ZYRTEC  Take 10 mg by mouth daily.     COMBIVENT RESPIMAT 20-100 MCG/ACT Aers respimat  Generic drug:  Ipratropium-Albuterol  Inhale 1 puff into the lungs every 6 (six) hours as needed for wheezing or shortness of breath.     labetalol 200 MG tablet  Commonly known as:  NORMODYNE  Take 200 mg by mouth daily.     oxyCODONE-acetaminophen 5-325 MG tablet  Commonly known as:  ROXICET  Take 1 tablet by mouth every 4 (four) hours as needed for moderate pain.     SYNTHROID 175 MCG tablet  Generic drug:  levothyroxine  Take 175 mcg by mouth daily.           Follow-up Information    Follow up with [name], MD In 10 days.   Specialty:  Surgery   Contact information:   [address] [place] [id number] [phone]       Lattie Hawichard E Avedis Bevis, MD, FACS

## 2015-02-06 NOTE — Discharge Instructions (Signed)
Remove dressing in 24 hours. °May shower in 24 hours. °Leave paper strips in place. °Resume all home medications. °Follow-up with Dr. Aritha Huckeba in 10 days. °

## 2015-02-06 NOTE — Progress Notes (Signed)
1 Day Post-Op  Subjective: Status post laparoscopic cholecystectomy. Her right upper quadrant pain is gone. There was a discrepancy about the ultrasound findings and the operative findings but her pain is improved if not resolved. She has no nausea and is tolerating clear liquid diet. She wants to go home.  Objective: Vital signs in last 24 hours: Temp:  [98 F (36.7 C)-98.8 F (37.1 C)] 98.4 F (36.9 C) (12/07 0828) Pulse Rate:  [67-99] 87 (12/07 0828) Resp:  [13-32] 16 (12/07 0828) BP: (113-156)/(62-88) 127/70 mmHg (12/07 0828) SpO2:  [96 %-100 %] 100 % (12/07 0828) Last BM Date: 02/04/15  Intake/Output from previous day: 12/06 0701 - 12/07 0700 In: 2450 [P.O.:480; I.V.:1970] Out: 2750 [Urine:2750] Intake/Output this shift:    Physical exam:  Obese soft nontender abdomen wounds dressed nontender calves.  Lab Results: CBC   Recent Labs  02/04/15 1831 02/06/15 0452  WBC 10.5 15.4*  HGB 12.9 12.0  HCT 38.5 35.5  PLT 268 251   BMET  Recent Labs  02/04/15 1831 02/06/15 0452  NA 135 136  K 3.7 4.0  CL 105 105  CO2 24 23  GLUCOSE 137* 245*  BUN 9 7  CREATININE 0.92 0.88  CALCIUM 9.1 8.9   PT/INR No results for input(s): LABPROT, INR in the last 72 hours. ABG No results for input(s): PHART, HCO3 in the last 72 hours.  Invalid input(s): PCO2, PO2  Studies/Results: [place] Abdomen Limited Ruq  02/04/2015  CLINICAL DATA:  Epigastric abdominal pain, worse after eating. EXAM: US ABDOMEN LIMITED - RIGHT UPPER QUADRANT COMPARISON:  None available FINDINGS: Gallbladder: There is a shadowing echogenic focus within the gallbladder measuring 2.7 cm which likely represents a gallstone. No definite mobility was seen with change of positioning. There is borderline gallbladder wall thickening, with maximum diameter of 2.6 mm. No pericholecystic fluid is seen. Positive sonographic Murphy's sign was noted. Common bile duct: Diameter: 3 mm. Liver: Diffusely increased echogenicity of  the liver was noted. IMPRESSION: Cholelithiasis with signs of questionable acute cholecystitis. Please correlate clinically. Diffusely increased echogenicity of the liver which may be seen with hepatic steatosis. These results were called by telephone at the time of interpretation on 02/04/2015 at 9:03 pm to Dr. Ileana RoupJAMES MCSHANE , who verbally acknowledged these results. Electronically Signed   By: Ted Mcalpineobrinka  Dimitrova M.D.   On: 02/04/2015 21:06    Anti-infectives: Anti-infectives    Start     Dose/Rate Route Frequency Ordered Stop   02/05/15 0100  cefTRIAXone (ROCEPHIN) 2 g in dextrose 5 % 50 mL IVPB     2 g 100 mL/hr over 30 Minutes Intravenous Every 12 hours 02/05/15 0100        Assessment/Plan: s/p Procedure(s): LAPAROSCOPIC CHOLECYSTECTOMY WITH INTRAOPERATIVE CHOLANGIOGRAM   Patient doing very well recommend discharge today to follow-up in 10 days will advance diet.  Lattie Hawichard E Ashya Nicolaisen, MD, FACS  02/06/2015

## 2015-02-08 ENCOUNTER — Telehealth: Payer: Self-pay

## 2015-02-08 NOTE — Telephone Encounter (Signed)
Post discharge call to patient made at this time. Pain is controlled with minimal medication. No questions or concerns.   Patient had problems with constipation immediately after surgery but this has now subsided with increasing water and high fiber foods. Encouraged patient to continue this while taking narcotic pain medication.  Confirmed patient appointment scheduled. Encouraged patient to call with any questions that arise prior to appointment.

## 2015-02-15 ENCOUNTER — Encounter: Payer: Self-pay | Admitting: Surgery

## 2015-02-15 ENCOUNTER — Ambulatory Visit (INDEPENDENT_AMBULATORY_CARE_PROVIDER_SITE_OTHER): Payer: Medicaid Other | Admitting: Surgery

## 2015-02-15 VITALS — BP 123/84 | HR 100 | Temp 98.3°F | Ht 67.0 in | Wt 257.0 lb

## 2015-02-15 DIAGNOSIS — K8000 Calculus of gallbladder with acute cholecystitis without obstruction: Secondary | ICD-10-CM

## 2015-02-15 NOTE — Patient Instructions (Signed)
Please give us a call if you have any questions or concerns. 

## 2015-02-15 NOTE — Progress Notes (Signed)
Outpatient postop visit  02/15/2015  Stacie Keller is an 34 y.o. female.    Procedure: Laparoscopic cholecystectomy  CC no problems  HPI: Status post laparoscopic cholecystectomy for acute and chronic cholecystitis Feels well tolerating a regular diet no fevers no chills  Medications reviewed.    Physical Exam:  BP 123/84 mmHg  Pulse 100  Temp(Src) 98.3 F (36.8 C)  Ht 5\' 7"  (1.702 m)  Wt 257 lb (116.574 kg)  BMI 40.24 kg/m2  LMP 01/12/2015 (Exact Date)    PE: Overly obese soft nontender abdomen wounds healing well no erythema or drainage    Assessment/Plan:  Larger reviewed patient doing very well recommend follow up on an as-needed basis  Lattie Hawichard E Gill Delrossi, MD, FACS

## 2015-04-19 ENCOUNTER — Other Ambulatory Visit: Payer: Self-pay | Admitting: Family Medicine

## 2015-04-24 ENCOUNTER — Other Ambulatory Visit: Payer: Self-pay | Admitting: Family Medicine

## 2015-04-24 DIAGNOSIS — N63 Unspecified lump in unspecified breast: Secondary | ICD-10-CM

## 2015-04-25 ENCOUNTER — Other Ambulatory Visit: Payer: Self-pay | Admitting: Family Medicine

## 2015-04-25 DIAGNOSIS — N63 Unspecified lump in unspecified breast: Secondary | ICD-10-CM

## 2015-05-07 ENCOUNTER — Ambulatory Visit
Admission: RE | Admit: 2015-05-07 | Discharge: 2015-05-07 | Disposition: A | Discharge status: Home / Self Care | Payer: Medicaid Other | Source: Ambulatory Visit | Attending: Family Medicine | Admitting: Family Medicine

## 2015-05-07 DIAGNOSIS — N63 Unspecified lump in unspecified breast: Secondary | ICD-10-CM

## 2015-07-25 ENCOUNTER — Emergency Department
Admission: EM | Admit: 2015-07-25 | Discharge: 2015-07-25 | Disposition: A | Discharge status: Home / Self Care | Payer: Medicaid Other | Attending: Emergency Medicine | Admitting: Emergency Medicine

## 2015-07-25 ENCOUNTER — Encounter: Payer: Self-pay | Admitting: Emergency Medicine

## 2015-07-25 DIAGNOSIS — J0100 Acute maxillary sinusitis, unspecified: Secondary | ICD-10-CM | POA: Insufficient documentation

## 2015-07-25 DIAGNOSIS — Z91040 Latex allergy status: Secondary | ICD-10-CM | POA: Insufficient documentation

## 2015-07-25 DIAGNOSIS — E785 Hyperlipidemia, unspecified: Secondary | ICD-10-CM | POA: Diagnosis not present

## 2015-07-25 DIAGNOSIS — F329 Major depressive disorder, single episode, unspecified: Secondary | ICD-10-CM | POA: Diagnosis not present

## 2015-07-25 DIAGNOSIS — H9203 Otalgia, bilateral: Secondary | ICD-10-CM | POA: Diagnosis present

## 2015-07-25 DIAGNOSIS — J45909 Unspecified asthma, uncomplicated: Secondary | ICD-10-CM | POA: Diagnosis not present

## 2015-07-25 DIAGNOSIS — Z7984 Long term (current) use of oral hypoglycemic drugs: Secondary | ICD-10-CM | POA: Insufficient documentation

## 2015-07-25 DIAGNOSIS — Z79899 Other long term (current) drug therapy: Secondary | ICD-10-CM | POA: Diagnosis not present

## 2015-07-25 DIAGNOSIS — E119 Type 2 diabetes mellitus without complications: Secondary | ICD-10-CM | POA: Diagnosis not present

## 2015-07-25 DIAGNOSIS — E039 Hypothyroidism, unspecified: Secondary | ICD-10-CM | POA: Diagnosis not present

## 2015-07-25 MED ORDER — PREDNISONE 10 MG PO TABS: ORAL_TABLET | ORAL | Status: DC

## 2015-07-25 MED ORDER — FLUTICASONE PROPIONATE 50 MCG/ACT NA SUSP: 2.0000 | Freq: Every day | NASAL | Status: DC

## 2015-07-25 MED ORDER — AMOXICILLIN-POT CLAVULANATE 875-125 MG PO TABS: 1.0000 | ORAL_TABLET | Freq: Two times a day (BID) | ORAL | Status: AC

## 2015-07-25 MED ORDER — ONDANSETRON 4 MG PO TBDP
4.0000 mg | ORAL_TABLET | Freq: Once | ORAL | Status: AC
  Administered 2015-07-25: 4 mg via ORAL
  Filled 2015-07-25: qty 1

## 2015-07-25 NOTE — ED Provider Notes (Signed)
[facility] Emergency Department Provider Note   ____________________________________________  Time seen: Approximately 9:59 AM  I have reviewed the triage vital signs and the nursing notes.   HISTORY  Chief Complaint Otalgia and Dental Pain    HPI Stacie Keller is a 35 y.o. female here complaining of sinus pressure, bilateral ear pain and facial pain. Patient states that she also has dental pain.Patient states she has a history of sinus infections in the past. Presently she takes a allergy pill daily. She is unaware of any fever or chills. She denies any nausea, vomiting or diarrhea. Occasionally she has had some cough especially at night. Currently patient rates her pain as 7/10.   Past Medical History  Diagnosis Date  . Anxiety   . Asthma   . Thyroid disease   . Depression   . Diabetes mellitus without complication [facility](HCC)     Patient Active Problem List   Diagnosis Date Noted  . Symptomatic cholelithiasis 02/04/2015  . Biliary colic   . Acid reflux 09/18/2013  . 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 Conjunctival vascular abnormality 08/01/2013  . Airway hyperreactivity 03/29/2013  . Better eye: total vision impairment, lesser eye: total vision impairment 03/29/2013  . Clinical depression 03/29/2013  . Major depressive disorder with single episode (HCC) 03/29/2013  . Type 2 diabetes mellitus (HCC) 03/29/2013  . H/O disease 08/23/2012  . 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 Common migraine with intractable migraine 10/09/2010  . Benign hypertension 09/06/2010  . HLD (hyperlipidemia) 09/06/2010  . Adult hypothyroidism 09/06/2010  . Obesity, diabetes, and hypertension syndrome (HCC) 09/06/2010  . Bilateral polycystic ovarian syndrome 09/06/2010    Past Surgical History  Procedure Laterality Date  . Appendectomy    . Cesarean section      x2  . Eye surgery    . Cholecystectomy N/A 02/05/2015    Procedure:  LAPAROSCOPIC CHOLECYSTECTOMY WITH INTRAOPERATIVE CHOLANGIOGRAM;  Surgeon: Lattie Hawichard E Cooper, MD;  Location: [facility] ORS;  Service: General;  Laterality: N/A;    Current Outpatient Rx  Name  Route  Sig  Dispense  Refill  . metFORMIN (GLUCOPHAGE) 500 MG tablet   Oral   Take 500 mg by mouth 2 (two) times daily with a meal.         . amoxicillin-clavulanate (AUGMENTIN) 875-125 MG tablet   Oral   Take 1 tablet by mouth 2 (two) times daily.   14 tablet   0   . cetirizine (ZYRTEC) 10 MG tablet   Oral   Take 1 tablet by mouth daily.      11   . fluticasone (FLONASE) 50 MCG/ACT nasal spray   Each Nare   Place 2 sprays into both nostrils daily.   16 g   2   . Ipratropium-Albuterol (COMBIVENT RESPIMAT) 20-100 MCG/ACT AERS respimat   Inhalation   Inhale 1 puff into the lungs every 6 (six) hours as needed for wheezing or shortness of breath.          . labetalol (NORMODYNE) 200 MG tablet   Oral   Take 200 mg by mouth daily.          Marland Kitchen. levothyroxine (SYNTHROID) 175 MCG tablet   Oral   Take 175 mcg by mouth daily.         . predniSONE (DELTASONE) 10 MG tablet      Take 6 tablets  today, on day 2 take 5 tablets, day 3 take 4 tablets,  day 4 take 3 tablets, day 5 take  2 tablets and 1 tablet the last day   21 tablet   0     Allergies Hydrocodone and Latex  Family History  Problem Relation Age of Onset  . Asthma Mother   . COPD Mother   . Heart disease Mother   . Hypertension Mother   . Breast cancer Paternal Grandmother 74    Social History Social History  Substance Use Topics  . Smoking status: Never Smoker   . Smokeless tobacco: Never Used  . Alcohol Use: No    Review of Systems Constitutional: Unaware of fever/chills Eyes: No visual changes. ENT: No sore throat. Positive bilateral ear pain. Positive dental pain. Positive facial pain. Cardiovascular: Denies chest pain. Respiratory: Denies shortness of breath. Musculoskeletal: Negative for back pain. Skin:  Negative for rash. Neurological: Negative for headaches  10-point ROS otherwise negative.  ____________________________________________   PHYSICAL EXAM:  VITAL SIGNS: ED Triage Vitals  Enc Vitals Group     BP 07/25/15 0848 140/83 mmHg     Pulse Rate 07/25/15 0848 99     Resp 07/25/15 0848 20     Temp 07/25/15 0848 98.2 F (36.8 C)     Temp Source 07/25/15 0848 Oral     SpO2 07/25/15 0848 97 %     Weight 07/25/15 0848 257 lb (116.574 kg)     Height 07/25/15 0848 5\' 7"  (1.702 m)     Head Cir --      Peak Flow --      Pain Score 07/25/15 0848 7     Pain Loc --      Pain Edu? --      Excl. in GC? --     Constitutional: Alert and oriented. Well appearing and in no acute distress. Eyes: Conjunctivae are normal. PERRL. EOMI. Head: Atraumatic.Positive lateral maxillary sinus tenderness to percussion. Nose: Mild congestion/rhinnorhea. EACs are clear bilaterally. TMs are dull but no erythema or injection was seen. Mouth/Throat: Mucous membranes are moist.  Oropharynx non-erythematous. Positive posterior drainage. Neck: No stridor.   Hematological/Lymphatic/Immunilogical: No cervical lymphadenopathy. Cardiovascular: Normal rate, regular rhythm. Grossly normal heart sounds.  Good peripheral circulation. Respiratory: Normal respiratory effort.  No retractions. Lungs CTAB. Gastrointestinal: Soft and nontender. No distention.  Musculoskeletal: Moves upper and lower extremities without any difficulty normal gait was noted. Neurologic:  Normal speech and language. No gross focal neurologic deficits are appreciated. No gait instability. Skin:  Skin is warm, dry and intact. No rash noted. Psychiatric: Mood and affect are normal. Speech and behavior are normal.  ____________________________________________   LABS (all labs ordered are listed, but only abnormal results are displayed)  Labs Reviewed - No data to display  PROCEDURES  Procedure(s) performed: None  Critical Care  performed: No  ____________________________________________   INITIAL IMPRESSION / ASSESSMENT AND PLAN / ED COURSE  Pertinent labs & imaging results that were available during my care of the patient were reviewed by me and considered in my medical decision making (see chart for details).  Patient is unsure when last time she had a sinus infection. Patient was started on Augmentin 875 one twice a day for 10 days along with Flonase nasal spray 2 sprays each nostril daily and prednisone 60 mg 6 day taper. Patient is to follow-up with [facility] if any continued problems with sinus infections. ____________________________________________   FINAL CLINICAL IMPRESSION(S) / ED DIAGNOSES  Final diagnoses:  Acute maxillary sinusitis, recurrence not specified  NEW MEDICATIONS STARTED DURING THIS VISIT:  Discharge Medication List as of 07/25/2015 10:35 AM    START taking these medications   Details  amoxicillin-clavulanate (AUGMENTIN) 875-125 MG tablet Take 1 tablet by mouth 2 (two) times daily., Starting 07/25/2015, Until Thu 08/01/15, Print    fluticasone (FLONASE) 50 MCG/ACT nasal spray Place 2 sprays into both nostrils daily., Starting 07/25/2015, Until Fri 07/24/16, Print    predniSONE (DELTASONE) 10 MG tablet Take 6 tablets  today, on day 2 take 5 tablets, day 3 take 4 tablets, day 4 take 3 tablets, day 5 take  2 tablets and 1 tablet the last day, Print         Note:  This document was prepared using Dragon voice recognition software and may include unintentional dictation errors.    Tommi Rumps, PA-C 07/25/15 1409  Arnaldo Natal, MD 07/25/15 [phone]

## 2015-07-25 NOTE — ED Notes (Signed)
Pt to ed with c/o sinus pressure in head and teeth and bilat ears x 2 days.

## 2015-07-25 NOTE — Discharge Instructions (Signed)
Sinusitis, Adult Sinusitis is redness, soreness, and puffiness (inflammation) of the air pockets in the bones of your face (sinuses). The redness, soreness, and puffiness can cause air and mucus to get trapped in your sinuses. This can allow germs to grow and cause an infection.  HOME CARE   Drink enough fluids to keep your pee (urine) clear or pale yellow.  Use a humidifier in your home.  Run a hot shower to create steam in the bathroom. Sit in the bathroom with the door closed. Breathe in the steam 3-4 times a day.  Put a warm, moist washcloth on your face 3-4 times a day, or as told by your doctor.  Use salt water sprays (saline sprays) to wet the thick fluid in your nose. This can help the sinuses drain.  Only take medicine as told by your doctor. GET HELP RIGHT AWAY IF:   Your pain gets worse.  You have very bad headaches.  You are sick to your stomach (nauseous).  You throw up (vomit).  You are very sleepy (drowsy) all the time.  Your face is puffy (swollen).  Your vision changes.  You have a stiff neck.  You have trouble breathing. MAKE SURE YOU:   Understand these instructions.  Will watch your condition.  Will get help right away if you are not doing well or get worse.   This information is not intended to replace advice given to you by your health care provider. Make sure you discuss any questions you have with your health care provider.   Document Released: 08/05/2007 Document Revised: 03/09/2014 Document Reviewed: 09/22/2011 Elsevier Interactive Patient Education 2016 [employer]    [place] If any continued problems. Begin taking Augmentin as directed, use Flonase as directed, and begin taking prednisone 6 day taper. Continue your regular medications.

## 2015-07-25 NOTE — ED Notes (Signed)
States she has had sinus pressure and congestion for couple of days   Unsure of fever but states has had "hot"flashes..Marland Kitchen

## 2015-09-15 ENCOUNTER — Emergency Department: Payer: Medicaid Other

## 2015-09-15 ENCOUNTER — Encounter: Payer: Self-pay | Admitting: Emergency Medicine

## 2015-09-15 DIAGNOSIS — Z7951 Long term (current) use of inhaled steroids: Secondary | ICD-10-CM | POA: Insufficient documentation

## 2015-09-15 DIAGNOSIS — E785 Hyperlipidemia, unspecified: Secondary | ICD-10-CM | POA: Diagnosis not present

## 2015-09-15 DIAGNOSIS — I10 Essential (primary) hypertension: Secondary | ICD-10-CM | POA: Diagnosis not present

## 2015-09-15 DIAGNOSIS — F329 Major depressive disorder, single episode, unspecified: Secondary | ICD-10-CM | POA: Diagnosis not present

## 2015-09-15 DIAGNOSIS — E119 Type 2 diabetes mellitus without complications: Secondary | ICD-10-CM | POA: Diagnosis not present

## 2015-09-15 DIAGNOSIS — J45909 Unspecified asthma, uncomplicated: Secondary | ICD-10-CM | POA: Insufficient documentation

## 2015-09-15 DIAGNOSIS — E039 Hypothyroidism, unspecified: Secondary | ICD-10-CM | POA: Insufficient documentation

## 2015-09-15 DIAGNOSIS — G43909 Migraine, unspecified, not intractable, without status migrainosus: Secondary | ICD-10-CM | POA: Diagnosis present

## 2015-09-15 DIAGNOSIS — Z7984 Long term (current) use of oral hypoglycemic drugs: Secondary | ICD-10-CM | POA: Insufficient documentation

## 2015-09-15 DIAGNOSIS — Z79899 Other long term (current) drug therapy: Secondary | ICD-10-CM | POA: Insufficient documentation

## 2015-09-15 DIAGNOSIS — R079 Chest pain, unspecified: Secondary | ICD-10-CM | POA: Insufficient documentation

## 2015-09-15 DIAGNOSIS — Z7952 Long term (current) use of systemic steroids: Secondary | ICD-10-CM | POA: Insufficient documentation

## 2015-09-15 DIAGNOSIS — G43809 Other migraine, not intractable, without status migrainosus: Secondary | ICD-10-CM | POA: Insufficient documentation

## 2015-09-15 LAB — CBC
HEMATOCRIT: 41.1 % (ref 35.0–47.0)
HEMOGLOBIN: 13.7 g/dL (ref 12.0–16.0)
MCH: 25 pg — AB (ref 26.0–34.0)
MCHC: 33.3 g/dL (ref 32.0–36.0)
MCV: 75.2 fL — AB (ref 80.0–100.0)
Platelets: 268 10*3/uL (ref 150–440)
RBC: 5.46 MIL/uL — AB (ref 3.80–5.20)
RDW: 15.5 % — ABNORMAL HIGH (ref 11.5–14.5)
WBC: 11.6 10*3/uL — ABNORMAL HIGH (ref 3.6–11.0)

## 2015-09-15 LAB — GLUCOSE, CAPILLARY: Glucose-Capillary: 160 mg/dL — ABNORMAL HIGH (ref 65–99)

## 2015-09-15 MED ORDER — ONDANSETRON 4 MG PO TBDP
4.0000 mg | ORAL_TABLET | Freq: Once | ORAL | Status: AC | PRN
  Administered 2015-09-15: 4 mg via ORAL
  Filled 2015-09-15: qty 1

## 2015-09-15 MED ORDER — OXYCODONE-ACETAMINOPHEN 5-325 MG PO TABS
1.0000 | ORAL_TABLET | ORAL | Status: DC | PRN
  Administered 2015-09-15: 1 via ORAL
  Filled 2015-09-15: qty 1

## 2015-09-15 NOTE — ED Notes (Signed)
Pt states had has several months of intermittent headaches, chest pain, dizziness. Pt states "i have a migraine right now". Pt states nausea, sensitivity to light and sound. Pt denies fever.

## 2015-09-16 ENCOUNTER — Emergency Department
Admission: EM | Admit: 2015-09-16 | Discharge: 2015-09-16 | Disposition: A | Discharge status: Home / Self Care | Payer: Medicaid Other | Attending: Emergency Medicine | Admitting: Emergency Medicine

## 2015-09-16 DIAGNOSIS — R42 Dizziness and giddiness: Secondary | ICD-10-CM

## 2015-09-16 DIAGNOSIS — G43809 Other migraine, not intractable, without status migrainosus: Secondary | ICD-10-CM

## 2015-09-16 DIAGNOSIS — R079 Chest pain, unspecified: Secondary | ICD-10-CM

## 2015-09-16 HISTORY — DX: Migraine, unspecified, not intractable, without status migrainosus: G43.909

## 2015-09-16 LAB — BASIC METABOLIC PANEL
ANION GAP: 7 (ref 5–15)
BUN: 13 mg/dL (ref 6–20)
CHLORIDE: 106 mmol/L (ref 101–111)
CO2: 24 mmol/L (ref 22–32)
Calcium: 9.3 mg/dL (ref 8.9–10.3)
Creatinine, Ser: 0.93 mg/dL (ref 0.44–1.00)
GFR calc non Af Amer: >60 mL/min (ref >60)
Glucose, Bld: 160 mg/dL — ABNORMAL HIGH (ref 65–99)
Potassium: 3.9 mmol/L (ref 3.5–5.1)
Sodium: 137 mmol/L (ref 135–145)

## 2015-09-16 LAB — TROPONIN I: Troponin I: <0.03 ng/mL (ref <0.03)

## 2015-09-16 MED ORDER — PROMETHAZINE HCL 25 MG PO TABS: 25.0000 mg | ORAL_TABLET | Freq: Four times a day (QID) | ORAL | Status: DC | PRN

## 2015-09-16 NOTE — ED Notes (Signed)
Pt alert and oriented X4, active, cooperative, pt in NAD. RR even and unlabored, color WNL.  Pt informed to return if any life threatening symptoms occur.   

## 2015-09-16 NOTE — ED Provider Notes (Signed)
[facility] Emergency Department Provider Note   ____________________________________________  Time seen: Approximately 2:58 AM  I have reviewed the triage vital signs and the nursing notes.   HISTORY  Chief Complaint Headache; Chest Pain; and Dizziness    HPI Stacie Keller is a 35 y.o. female who presents to the ED from home with a chief complaint of migraine headache, dizziness and chest pain. Patient has a long-standing history of recurrent migraine headaches, used to see a neurologist and used to take Topamax. She had not taking Topamax for years until her PCP placed her back on it recently. Patient states she has been having good relief with Topamax. She has also been seeing her PCP for recurrent dizziness as well as chest pain, both for the past 3-4 months. None of these symptoms are new for her; however, she states she had the combination of all 3 symptoms at the same time tonight and that's why she presented to the ED for pain relief. Denies recent fever, chills, shortness of breath, vomiting, diarrhea. Denies recent travel or trauma. Denies hormone use. Nothing made her symptoms better or worse.   Past Medical History  Diagnosis Date  . Anxiety   . Asthma   . Thyroid disease   . Depression   . Diabetes mellitus without complication (HCC)   . Migraines     Patient Active Problem List   Diagnosis Date Noted  . Symptomatic cholelithiasis 02/04/2015  . Biliary colic   . Acid reflux 09/18/2013  . 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 Conjunctival vascular abnormality 08/01/2013  . Airway hyperreactivity 03/29/2013  . Better eye: total vision impairment, lesser eye: total vision impairment 03/29/2013  . Clinical depression 03/29/2013  . Major depressive disorder with single episode (HCC) 03/29/2013  . Type 2 diabetes mellitus (HCC) 03/29/2013  . H/O disease 08/23/2012  . 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 Common  migraine with intractable migraine 10/09/2010  . Benign hypertension 09/06/2010  . HLD (hyperlipidemia) 09/06/2010  . Adult hypothyroidism 09/06/2010  . Obesity, diabetes, and hypertension syndrome (HCC) 09/06/2010  . Bilateral polycystic ovarian syndrome 09/06/2010  Rotary nystagmus since birth  Past Surgical History  Procedure Laterality Date  . Appendectomy    . Cesarean section      x2  . Eye surgery    . Cholecystectomy N/A 02/05/2015    Procedure: LAPAROSCOPIC CHOLECYSTECTOMY WITH INTRAOPERATIVE CHOLANGIOGRAM;  Surgeon: Lattie Hawichard E Cooper, MD;  Location: [facility] ORS;  Service: General;  Laterality: N/A;    Current Outpatient Rx  Name  Route  Sig  Dispense  Refill  . cetirizine (ZYRTEC) 10 MG tablet   Oral   Take 1 tablet by mouth daily.      11   . fluticasone (FLONASE) 50 MCG/ACT nasal spray   Each Nare   Place 2 sprays into both nostrils daily.   16 g   2   . Ipratropium-Albuterol (COMBIVENT RESPIMAT) 20-100 MCG/ACT AERS respimat   Inhalation   Inhale 1 puff into the lungs every 6 (six) hours as needed for wheezing or shortness of breath.          . labetalol (NORMODYNE) 200 MG tablet   Oral   Take 200 mg by mouth daily.          Marland Kitchen. levothyroxine (SYNTHROID) 175 MCG tablet   Oral   Take 175 mcg by mouth daily.         . metFORMIN (GLUCOPHAGE) 500  MG tablet   Oral   Take 500 mg by mouth 2 (two) times daily with a meal.         . predniSONE (DELTASONE) 10 MG tablet      Take 6 tablets  today, on day 2 take 5 tablets, day 3 take 4 tablets, day 4 take 3 tablets, day 5 take  2 tablets and 1 tablet the last day   21 tablet   0     Allergies Hydrocodone and Latex  Family History  Problem Relation Age of Onset  . Asthma Mother   . COPD Mother   . Heart disease Mother   . Hypertension Mother   . Breast cancer Paternal Grandmother 78    Social History Social History  Substance Use Topics  . Smoking status: Never Smoker   . Smokeless tobacco:  Never Used  . Alcohol Use: No    Review of Systems  Constitutional: No fever/chills. Eyes: No visual changes. ENT: No sore throat. Cardiovascular: Positive for chest pain. Respiratory: Denies shortness of breath. Gastrointestinal: No abdominal pain.  No nausea, no vomiting.  No diarrhea.  No constipation. Genitourinary: Negative for dysuria. Musculoskeletal: Negative for back pain. Skin: Negative for rash. Neurological: Positive for headaches and dizziness. Negative for focal weakness or numbness.  10-point ROS otherwise negative.  ____________________________________________   PHYSICAL EXAM:  VITAL SIGNS: ED Triage Vitals  Enc Vitals Group     BP 09/15/15 2314 127/90 mmHg     Pulse Rate 09/15/15 2314 91     Resp 09/15/15 2314 16     Temp 09/15/15 2314 98.5 F (36.9 C)     Temp Source 09/15/15 2314 Oral     SpO2 09/15/15 2314 100 %     Weight 09/15/15 2314 256 lb (116.121 kg)     Height 09/15/15 2314 5\' 7"  (1.702 m)     Head Cir --      Peak Flow --      Pain Score 09/15/15 2315 7     Pain Loc --      Pain Edu? --      Excl. in GC? --     Constitutional: Alert and oriented. Well appearing and in no acute distress. Eyes: Conjunctivae are normal. PERRL. EOMI. Rotary nystagmus noted. Head: Atraumatic. Nose: No congestion/rhinnorhea. Mouth/Throat: Mucous membranes are moist.  Oropharynx non-erythematous. Neck: No stridor.  Supple neck without meningismus.  No carotid bruits. Cardiovascular: Normal rate, regular rhythm. Grossly normal heart sounds.  Good peripheral circulation. Respiratory: Normal respiratory effort.  No retractions. Lungs CTAB. Gastrointestinal: Soft and nontender. No distention. No abdominal bruits. No CVA tenderness. Musculoskeletal: No lower extremity tenderness nor edema.  No joint effusions. Neurologic:  Normal speech and language. No gross focal neurologic deficits are appreciated. No gait instability. Skin:  Skin is warm, dry and intact. No  rash noted. Psychiatric: Mood and affect are normal. Speech and behavior are normal.  ____________________________________________   LABS (all labs ordered are listed, but only abnormal results are displayed)  Labs Reviewed  BASIC METABOLIC PANEL - Abnormal; Notable for the following:    Glucose, Bld 160 (*)    All other components within normal limits  CBC - Abnormal; Notable for the following:    WBC 11.6 (*)    RBC 5.46 (*)    MCV 75.2 (*)    MCH 25.0 (*)    RDW 15.5 (*)    All other components within normal limits  GLUCOSE, CAPILLARY - Abnormal; Notable for  the following:    Glucose-Capillary 160 (*)    All other components within normal limits  TROPONIN I  POC URINE PREG, ED   ____________________________________________  EKG  ED ECG REPORT I, Gaynell Eggleton J, the attending physician, personally viewed and interpreted this ECG.   Date: 09/16/2015  EKG Time: 2328  Rate: 85  Rhythm: normal EKG, normal sinus rhythm  Axis: Normal  Intervals:none  ST&T Change: Nonspecific  ____________________________________________  RADIOLOGY  CT head without contrast interpreted per Dr. Mayford Knife: No acute intracranial process.  Chest 2 view (viewed by me, interpreted per Dr. Mayford Knife): No active cold or pulmonary disease. ____________________________________________   PROCEDURES  Procedure(s) performed: None  Procedures  Critical Care performed: No  ____________________________________________   INITIAL IMPRESSION / ASSESSMENT AND PLAN / ED COURSE  Pertinent labs & imaging results that were available during my care of the patient were reviewed by me and considered in my medical decision making (see chart for details).  35 year old female with recurrent migraine headaches, dizziness and chest pain for several months who presents with the above symptoms. She was given analgesia while awaiting treatment room and currently voices no complaints. She is reassured by the  negative imaging and laboratory results and states she has an appointment with her PCP tomorrow for follow-up. She has no nausea medicine at home; will write prescription for Phenergan. Strict return precautions given. Patient verbalizes understanding and agrees with plan of care. ____________________________________________   FINAL CLINICAL IMPRESSION(S) / ED DIAGNOSES  Final diagnoses:  Other migraine without status migrainosus, not intractable  Dizziness  Chest pain, unspecified chest pain type      NEW MEDICATIONS STARTED DURING THIS VISIT:  New Prescriptions   No medications on file     Note:  This document was prepared using Dragon voice recognition software and may include unintentional dictation errors.    Irean Hong, MD 09/16/15 [phone]

## 2015-09-16 NOTE — Discharge Instructions (Signed)
1. Continue Topamax as directed by your doctor. 2. You may take Phenergan as needed for nausea. 3. Return to the ER for worsening symptoms, persistent vomiting, difficult breathing or other concerns.  Recurrent Migraine Headache A migraine headache is an intense, throbbing pain on one or both sides of your head. Recurrent migraines keep coming back. A migraine can last for 30 minutes to several hours. CAUSES  The exact cause of a migraine headache is not always known. However, a migraine may be caused when nerves in the brain become irritated and release chemicals that cause inflammation. This causes pain. Certain things may also trigger migraines, such as:   Alcohol.  Smoking.  Stress.  Menstruation.  Aged cheeses.  Foods or drinks that contain nitrates, glutamate, aspartame, or tyramine.  Lack of sleep.  Chocolate.  Caffeine.  Hunger.  Physical exertion.  Fatigue.  Medicines used to treat chest pain (nitroglycerine), birth control pills, estrogen, and some blood pressure medicines. SYMPTOMS   Pain on one or both sides of your head.  Pulsating or throbbing pain.  Severe pain that prevents daily activities.  Pain that is aggravated by any physical activity.  Nausea, vomiting, or both.  Dizziness.  Pain with exposure to bright lights, loud noises, or activity.  General sensitivity to bright lights, loud noises, or smells. Before you get a migraine, you may get warning signs that a migraine is coming (aura). An aura may include:  Seeing flashing lights.  Seeing bright spots, halos, or zigzag lines.  Having tunnel vision or blurred vision.  Having feelings of numbness or tingling.  Having trouble talking.  Having muscle weakness. DIAGNOSIS  A recurrent migraine headache is often diagnosed based on:  Symptoms.  Physical examination.  A CT scan or MRI of your head. These imaging tests cannot diagnose migraines but can help rule out other causes of  headaches.  TREATMENT  Medicines may be given for pain and nausea. Medicines can also be given to help prevent recurrent migraines. HOME CARE INSTRUCTIONS  Only take over-the-counter or prescription medicines for pain or discomfort as directed by your health care provider. The use of long-term narcotics is not recommended.  Lie down in a dark, quiet room when you have a migraine.  Keep a journal to find out what may trigger your migraine headaches. For example, write down:  What you eat and drink.  How much sleep you get.  Any change to your diet or medicines.  Limit alcohol consumption.  Quit smoking if you smoke.  Get 7-9 hours of sleep, or as recommended by your health care provider.  Limit stress.  Keep lights dim if bright lights bother you and make your migraines worse. SEEK MEDICAL CARE IF:   You do not get relief from the medicines given to you.  You have a recurrence of pain.  You have a fever. SEEK IMMEDIATE MEDICAL CARE IF:  Your migraine becomes severe.  You have a stiff neck.  You have loss of vision.  You have muscular weakness or loss of muscle control.  You start losing your balance or have trouble walking.  You feel faint or pass out.  You have severe symptoms that are different from your first symptoms. MAKE SURE YOU:   Understand these instructions.  Will watch your condition.  Will get help right away if you are not doing well or get worse.   This information is not intended to replace advice given to you by your health care provider. Make sure  you discuss any questions you have with your health care provider.   Document Released: 11/11/2000 Document Revised: 03/09/2014 Document Reviewed: 10/24/2012 Elsevier Interactive Patient Education 2016 [employer] Inc[employer]  Nonspecific Chest Pain  Chest pain can be caused by many different conditions. There is always a chance that your pain could be related to something serious, such as a heart  attack or a blood clot in your lungs. Chest pain can also be caused by conditions that are not life-threatening. If you have chest pain, it is very important to follow up with your health care provider. CAUSES  Chest pain can be caused by:  Heartburn.  Pneumonia or bronchitis.  Anxiety or stress.  Inflammation around your heart (pericarditis) or lung (pleuritis or pleurisy).  A blood clot in your lung.  A collapsed lung (pneumothorax). It can develop suddenly on its own (spontaneous pneumothorax) or from trauma to the chest.  Shingles infection (varicella-zoster virus).  Heart attack.  Damage to the bones, muscles, and cartilage that make up your chest wall. This can include:  Bruised bones due to injury.  Strained muscles or cartilage due to frequent or repeated coughing or overwork.  Fracture to one or more ribs.  Sore cartilage due to inflammation (costochondritis). RISK FACTORS  Risk factors for chest pain may include:  Activities that increase your risk for trauma or injury to your chest.  Respiratory infections or conditions that cause frequent coughing.  Medical conditions or overeating that can cause heartburn.  Heart disease or family history of heart disease.  Conditions or health behaviors that increase your risk of developing a blood clot.  Having had chicken pox (varicella zoster). SIGNS AND SYMPTOMS Chest pain can feel like:  Burning or tingling on the surface of your chest or deep in your chest.  Crushing, pressure, aching, or squeezing pain.  Dull or sharp pain that is worse when you move, cough, or take a deep breath.  Pain that is also felt in your back, neck, shoulder, or arm, or pain that spreads to any of these areas. Your chest pain may come and go, or it may stay constant. DIAGNOSIS Lab tests or other studies may be needed to find the cause of your pain. Your health care provider may have you take a test called an ambulatory ECG  (electrocardiogram). An ECG records your heartbeat patterns at the time the test is performed. You may also have other tests, such as:  Transthoracic echocardiogram (TTE). During echocardiography, sound waves are used to create a picture of all of the heart structures and to look at how blood flows through your heart.  Transesophageal echocardiogram (TEE).This is a more advanced imaging test that obtains images from inside your body. It allows your health care provider to see your heart in finer detail.  Cardiac monitoring. This allows your health care provider to monitor your heart rate and rhythm in real time.  Holter monitor. This is a portable device that records your heartbeat and can help to diagnose abnormal heartbeats. It allows your health care provider to track your heart activity for several days, if needed.  Stress tests. These can be done through exercise or by taking medicine that makes your heart beat more quickly.  Blood tests.  Imaging tests. TREATMENT  Your treatment depends on what is causing your chest pain. Treatment may include:  Medicines. These may include:  Acid blockers for heartburn.  Anti-inflammatory medicine.  Pain medicine for inflammatory conditions.  Antibiotic medicine, if an infection is present.  Medicines to dissolve blood clots.  Medicines to treat coronary artery disease.  Supportive care for conditions that do not require medicines. This may include:  Resting.  Applying heat or cold packs to injured areas.  Limiting activities until pain decreases. HOME CARE INSTRUCTIONS  If you were prescribed an antibiotic medicine, finish it all even if you start to feel better.  Avoid any activities that bring on chest pain.  Do not use any tobacco products, including cigarettes, chewing tobacco, or electronic cigarettes. If you need help quitting, ask your health care provider.  Do not drink alcohol.  Take medicines only as directed by  your health care provider.  Keep all follow-up visits as directed by your health care provider. This is important. This includes any further testing if your chest pain does not go away.  If heartburn is the cause for your chest pain, you may be told to keep your head raised (elevated) while sleeping. This reduces the chance that acid will go from your stomach into your esophagus.  Make lifestyle changes as directed by your health care provider. These may include:  Getting regular exercise. Ask your health care provider to suggest some activities that are safe for you.  Eating a heart-healthy diet. A registered dietitian can help you to learn healthy eating options.  Maintaining a healthy weight.  Managing diabetes, if necessary.  Reducing stress. SEEK MEDICAL CARE IF:  Your chest pain does not go away after treatment.  You have a rash with blisters on your chest.  You have a fever. SEEK IMMEDIATE MEDICAL CARE IF:   Your chest pain is worse.  You have an increasing cough, or you cough up blood.  You have severe abdominal pain.  You have severe weakness.  You faint.  You have chills.  You have sudden, unexplained chest discomfort.  You have sudden, unexplained discomfort in your arms, back, neck, or jaw.  You have shortness of breath at any time.  You suddenly start to sweat, or your skin gets clammy.  You feel nauseous or you vomit.  You suddenly feel light-headed or dizzy.  Your heart begins to beat quickly, or it feels like it is skipping beats. These symptoms may represent a serious problem that is an emergency. Do not wait to see if the symptoms will go away. Get medical help right away. Call your local emergency services (911 in the [place] Do not drive yourself to the hospital.   This information is not intended to replace advice given to you by your health care provider. Make sure you discuss any questions you have with your health care provider.   Document  Released: 11/26/2004 Document Revised: 03/09/2014 Document Reviewed: 09/22/2013 Elsevier Interactive Patient Education 2016 Elsevier Inc[employer]  Dizziness Dizziness is a common problem. It is a feeling of unsteadiness or light-headedness. You may feel like you are about to faint. Dizziness can lead to injury if you stumble or fall. Anyone can become dizzy, but dizziness is more common in older adults. This condition can be caused by a number of things, including medicines, dehydration, or illness. HOME CARE INSTRUCTIONS Taking these steps may help with your condition: Eating and Drinking  Drink enough fluid to keep your urine clear or pale yellow. This helps to keep you from becoming dehydrated. Try to drink more clear fluids, such as water.  Do not drink alcohol.  Limit your caffeine intake if directed by your health care provider.  Limit your salt intake if directed by your  health care provider. Activity  Avoid making quick movements.  Rise slowly from chairs and steady yourself until you feel okay.  In the morning, first sit up on the side of the bed. When you feel okay, stand slowly while you hold onto something until you know that your balance is fine.  Move your legs often if you need to stand in one place for a long time. Tighten and relax your muscles in your legs while you are standing.  Do not drive or operate heavy machinery if you feel dizzy.  Avoid bending down if you feel dizzy. Place items in your home so that they are easy for you to reach without leaning over. Lifestyle  Do not use any tobacco products, including cigarettes, chewing tobacco, or electronic cigarettes. If you need help quitting, ask your health care provider.  Try to reduce your stress level, such as with yoga or meditation. Talk with your health care provider if you need help. General Instructions  Watch your dizziness for any changes.  Take medicines only as directed by your health care provider.  Talk with your health care provider if you think that your dizziness is caused by a medicine that you are taking.  Tell a friend or a family member that you are feeling dizzy. If he or she notices any changes in your behavior, have this person call your health care provider.  Keep all follow-up visits as directed by your health care provider. This is important. SEEK MEDICAL CARE IF:  Your dizziness does not go away.  Your dizziness or light-headedness gets worse.  You feel nauseous.  You have reduced hearing.  You have new symptoms.  You are unsteady on your feet or you feel like the room is spinning. SEEK IMMEDIATE MEDICAL CARE IF:  You vomit or have diarrhea and are unable to eat or drink anything.  You have problems talking, walking, swallowing, or using your arms, hands, or legs.  You feel generally weak.  You are not thinking clearly or you have trouble forming sentences. It may take a friend or family member to notice this.  You have chest pain, abdominal pain, shortness of breath, or sweating.  Your vision changes.  You notice any bleeding.  You have a headache.  You have neck pain or a stiff neck.  You have a fever.   This information is not intended to replace advice given to you by your health care provider. Make sure you discuss any questions you have with your health care provider.   Document Released: 08/12/2000 Document Revised: 07/03/2014 Document Reviewed: 02/12/2014 Elsevier Interactive Patient Education [employer]

## 2016-01-17 ENCOUNTER — Encounter (HOSPITAL_COMMUNITY): Payer: Self-pay | Admitting: Emergency Medicine

## 2016-01-17 ENCOUNTER — Emergency Department (HOSPITAL_COMMUNITY)
Admission: EM | Admit: 2016-01-17 | Discharge: 2016-01-17 | Disposition: A | Discharge status: Home / Self Care | Payer: Medicaid Other | Attending: Emergency Medicine | Admitting: Emergency Medicine

## 2016-01-17 DIAGNOSIS — J45909 Unspecified asthma, uncomplicated: Secondary | ICD-10-CM | POA: Diagnosis not present

## 2016-01-17 DIAGNOSIS — R55 Syncope and collapse: Secondary | ICD-10-CM | POA: Diagnosis present

## 2016-01-17 DIAGNOSIS — E039 Hypothyroidism, unspecified: Secondary | ICD-10-CM | POA: Insufficient documentation

## 2016-01-17 DIAGNOSIS — E119 Type 2 diabetes mellitus without complications: Secondary | ICD-10-CM | POA: Diagnosis not present

## 2016-01-17 DIAGNOSIS — Z91040 Latex allergy status: Secondary | ICD-10-CM | POA: Diagnosis not present

## 2016-01-17 LAB — BASIC METABOLIC PANEL
Anion gap: 8 (ref 5–15)
BUN: 9 mg/dL (ref 6–20)
CALCIUM: 9.3 mg/dL (ref 8.9–10.3)
CO2: 23 mmol/L (ref 22–32)
CREATININE: 0.93 mg/dL (ref 0.44–1.00)
Chloride: 106 mmol/L (ref 101–111)
GFR calc Af Amer: >60 mL/min (ref >60)
GLUCOSE: 127 mg/dL — AB (ref 65–99)
Potassium: 4.2 mmol/L (ref 3.5–5.1)
SODIUM: 137 mmol/L (ref 135–145)

## 2016-01-17 LAB — CBC
HCT: 38.5 % (ref 36.0–46.0)
Hemoglobin: 12.7 g/dL (ref 12.0–15.0)
MCH: 24.7 pg — ABNORMAL LOW (ref 26.0–34.0)
MCHC: 33 g/dL (ref 30.0–36.0)
MCV: 74.9 fL — ABNORMAL LOW (ref 78.0–100.0)
PLATELETS: 320 10*3/uL (ref 150–400)
RBC: 5.14 MIL/uL — ABNORMAL HIGH (ref 3.87–5.11)
RDW: 14.1 % (ref 11.5–15.5)
WBC: 10.3 10*3/uL (ref 4.0–10.5)

## 2016-01-17 LAB — CBG MONITORING, ED: GLUCOSE-CAPILLARY: 129 mg/dL — AB (ref 65–99)

## 2016-01-17 LAB — I-STAT BETA HCG BLOOD, ED (MC, WL, AP ONLY)

## 2016-01-17 NOTE — ED Triage Notes (Signed)
Pt coming from a vets office and according to staff had a syncopal episode that last approx 1 min. Pt reports that this is the second time today. Pt has been working with a MD for the last month and her MD seems to think that its due to anxiety. 18G LAC

## 2016-01-17 NOTE — ED Provider Notes (Signed)
[facility]-EM[facility]Y DEPT Provider Note   CSN: [id number] Arrival date & time: 01/17/16  1927     History   Chief Complaint Chief Complaint  Patient presents with  . Loss of Consciousness    HPI Stacie Keller is a 35 y.o. female.  HPI   Over the last several months has had syncopal episodes, 4-5 episodes over the last 4-5 months, and was seen by PCP.  Episodes occur, have lightheadedness, nausea, will sometimes pass out.  Was having chest discomfort with prior episodes but not today.  Also reports she has chest tightness when running around with her 5968yr old or exerting herself in any way, however has not had CP at rest and has not had CP today. Mother had several MIs with her first in her 4340s. [facility] in August. Tightness in chest, with exertion, shortness of breath. She reports she felt the Cardiologist attributed her symptoms to anxiety. She reports she does have anxiety and began to wonder if this was the case, however is worried given her family hx as well as other medical problems like DM. She did not have stress test or holter monitor largely because she felt the doctor ordered them because he said "if it would make you feel better I can."   Today was talking to therapist small talk wrapping up visit when she became lightheaded.  At therapist was lightheaded, short of breath, nauseas, did not pass out.  . Checking [place] dog [place] and passed out, out for 2 minutes. Felt lightheadedness prior to episode. Dont think hit head.  Today was different because no chest discomfort.  No chest pain or shortness of breath today. No palpitations or history of palpitations. No hx of DVT/PE, no surgeries or estrogen use.  Past Medical History:  Diagnosis Date  . Anxiety   . Asthma   . Depression   . Diabetes mellitus without complication (HCC)   . Migraines   . Thyroid disease     Patient Active Problem List   Diagnosis Date Noted  . Symptomatic cholelithiasis 02/04/2015    . Biliary colic   . Acid reflux 09/18/2013  . 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 Conjunctival vascular abnormality 08/01/2013  . Airway hyperreactivity 03/29/2013  . Better eye: total vision impairment, lesser eye: total vision impairment 03/29/2013  . Clinical depression 03/29/2013  . Major depressive disorder with single episode 03/29/2013  . Type 2 diabetes mellitus (HCC) 03/29/2013  . H/O disease 08/23/2012  . 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 Common migraine with intractable migraine 10/09/2010  . Benign hypertension 09/06/2010  . HLD (hyperlipidemia) 09/06/2010  . Adult hypothyroidism 09/06/2010  . Obesity, diabetes, and hypertension syndrome (HCC) 09/06/2010  . Bilateral polycystic ovarian syndrome 09/06/2010    Past Surgical History:  Procedure Laterality Date  . APPENDECTOMY    . CESAREAN SECTION     x2  . CHOLECYSTECTOMY N/A 02/05/2015   Procedure: LAPAROSCOPIC CHOLECYSTECTOMY WITH INTRAOPERATIVE CHOLANGIOGRAM;  Surgeon: Lattie Hawichard E Cooper, MD;  Location: [facility] ORS;  Service: General;  Laterality: N/A;  . EYE SURGERY      OB History    Gravida Para Term Preterm AB Living   2         1   SAB TAB Ectopic Multiple Live Births                   Home Medications    Prior to Admission medications  Medication Sig Start Date End Date Taking? Authorizing Provider  cetirizine (ZYRTEC) 10 MG tablet Take 1 tablet by mouth daily. 01/21/15  Yes Historical Provider, MD  ibuprofen (ADVIL,MOTRIN) 200 MG tablet Take 600 mg by mouth every 6 (six) hours as needed for headache.   Yes Historical Provider, MD  Ipratropium-Albuterol (COMBIVENT RESPIMAT) 20-100 MCG/ACT AERS respimat Inhale 1 puff into the lungs every 6 (six) hours as needed for wheezing or shortness of breath.    Yes Historical Provider, MD  labetalol (NORMODYNE) 200 MG tablet Take 100 mg by mouth 2 (two) times daily.    Yes Historical Provider, MD  levothyroxine (SYNTHROID, LEVOTHROID)  200 MCG tablet Take 200 mcg by mouth daily before breakfast. NAME BRAND ONLY   Yes Historical Provider, MD  topiramate (TOPAMAX) 25 MG tablet Take 50 mg by mouth at bedtime. 08/27/15  Yes Historical Provider, MD  fluticasone (FLONASE) 50 MCG/ACT nasal spray Place 2 sprays into both nostrils daily. Patient not taking: Reported on 01/17/2016 07/25/15 07/24/16  Tommi Rumps, PA-C  predniSONE (DELTASONE) 10 MG tablet Take 6 tablets  today, on day 2 take 5 tablets, day 3 take 4 tablets, day 4 take 3 tablets, day 5 take  2 tablets and 1 tablet the last day Patient not taking: Reported on 01/17/2016 07/25/15   Tommi Rumps, PA-C  promethazine (PHENERGAN) 25 MG tablet Take 1 tablet (25 mg total) by mouth every 6 (six) hours as needed for nausea or vomiting. Patient not taking: Reported on 01/17/2016 09/16/15   Irean Hong, MD    Family History Family History  Problem Relation Age of Onset  . Asthma Mother   . COPD Mother   . Heart disease Mother   . Hypertension Mother   . Breast cancer Paternal Grandmother 42    Social History Social History  Substance Use Topics  . Smoking status: Never Smoker  . Smokeless tobacco: Never Used  . Alcohol use No     Allergies   Hydrocodone and Latex   Review of Systems Review of Systems  Constitutional: Negative for fever.  HENT: Negative for sore throat.   Eyes: Negative for visual disturbance.  Respiratory: Positive for shortness of breath. Negative for cough.   Cardiovascular: Negative for chest pain.  Gastrointestinal: Positive for nausea. Negative for abdominal pain.  Genitourinary: Negative for difficulty urinating.  Musculoskeletal: Negative for back pain and neck pain.  Skin: Negative for rash.  Neurological: Positive for syncope and light-headedness. Negative for weakness, numbness and headaches.     Physical Exam Updated Vital Signs BP 137/97   Pulse 83   Temp 98 F (36.7 C) (Oral)   Resp 12   SpO2 99%   Physical Exam    Constitutional: She is oriented to person, place, and time. She appears well-developed and well-nourished. No distress.  HENT:  Head: Normocephalic and atraumatic.  Eyes: Conjunctivae and EOM are normal.  Neck: Normal range of motion.  Cardiovascular: Normal rate, regular rhythm, normal heart sounds and intact distal pulses.  Exam reveals no gallop and no friction rub.   No murmur heard. Pulmonary/Chest: Effort normal and breath sounds normal. No respiratory distress. She has no wheezes. She has no rales.  Abdominal: Soft. She exhibits no distension. There is no tenderness. There is no guarding.  Musculoskeletal: She exhibits no edema or tenderness.  Neurological: She is alert and oriented to person, place, and time.  Skin: Skin is warm and dry. No rash noted. She is not diaphoretic. No  erythema.  Nursing note and vitals reviewed.    ED Treatments / Results  Labs (all labs ordered are listed, but only abnormal results are displayed) Labs Reviewed  BASIC METABOLIC PANEL - Abnormal; Notable for the following:       Result Value   Glucose, Bld 127 (*)    All other components within normal limits  CBC - Abnormal; Notable for the following:    RBC 5.14 (*)    MCV 74.9 (*)    MCH 24.7 (*)    All other components within normal limits  CBG MONITORING, ED - Abnormal; Notable for the following:    Glucose-Capillary 129 (*)    All other components within normal limits  URINALYSIS, ROUTINE W REFLEX MICROSCOPIC (NOT AT [facility])  I-STAT BETA HCG BLOOD, ED ([facility], [facility], [facility] ONLY)    EKG  EKG Interpretation  Date/Time:  Friday January 17 2016 19:34:11 EST Ventricular Rate:  78 PR Interval:    QRS Duration: 90 QT Interval:  389 QTC Calculation: 444 R Axis:   38 Text Interpretation:  Sinus rhythm No significant change since last tracing Confirmed by [facility] MD, Denny Peon ([id number]) on 01/17/2016 7:42:01 PM       Radiology No results found.  Procedures Procedures (including critical care  time)  Medications Ordered in ED Medications - No data to display   Initial Impression / Assessment and Plan / ED Course  I have reviewed the triage vital signs and the nursing notes.  Pertinent labs & imaging results that were available during my care of the patient were reviewed by me and considered in my medical decision making (see chart for details).  Clinical Course    35 year old female with a history of asthma, diabetes, family history of heart disease with mom having an MI in her 63s, presents with concern for syncopal episodes. Patient reports an episode of lightheadedness today, as well as an episode of lightheadedness and syncope. She has had initial workup with her PCP and saw cardiologist at [facility], however did not have stress testing or Holter monitoring.  EKG was evaluated by me and showed no signs of Brugada, no delta waves, no prolonged QTc, no signs of hypertrophic cardiomyopathy or other acute ST changes. CBC shows no signs of anemia. BMP shows no significant electrolyte abnormalities. Pregnancy test is negative. Patient denied any chest pain associated with episode today, and have low suspicion for MI. She is not hypoxic, not to Take, has no asymmetric leg swelling, is perc negative, and have low suspicion for pulmonary embolus.  She reported having lightheadedness while in the emergency department, with no change in heart rhythm seen on telemetry, and was noted to be in normal sinus rhythm throughout her stay.  Patient reported prior episodes of chest pain, worse with exertion which have been increasing, however denies any chest pain today.    Offered her possible syncope observation given multiple episodes, however also told her that outpatient follow-up is appropriate, given her preceding lightheadedness, as well as normal heart rhythm on monitor during episodes in the ED.  Did feel that patient should have an outpatient stress test, given her strong family history as well as  exertional components of her chest pain. She does not have resting chest pain or chest pain today so do not feel chest pain obs is necessary at this time. Recommend follow up with cardiology. Patient discharged in stable condition with understanding of reasons to return.   Final Clinical Impressions(s) / ED Diagnoses  Final diagnoses:  Near syncope  Syncope and collapse    New Prescriptions Discharge Medication List as of 01/17/2016 10:53 PM       Alvira MondayErin Yocheved Depner, MD 01/18/16 [phone]

## 2016-01-22 ENCOUNTER — Encounter: Payer: Self-pay | Admitting: Physician Assistant

## 2016-02-03 ENCOUNTER — Ambulatory Visit (INDEPENDENT_AMBULATORY_CARE_PROVIDER_SITE_OTHER): Payer: Medicaid Other | Admitting: Physician Assistant

## 2016-02-03 ENCOUNTER — Encounter: Payer: Self-pay | Admitting: Physician Assistant

## 2016-02-03 VITALS — BP 133/82 | HR 79 | Ht 67.0 in | Wt 259.4 lb

## 2016-02-03 DIAGNOSIS — Z8249 Family history of ischemic heart disease and other diseases of the circulatory system: Secondary | ICD-10-CM | POA: Insufficient documentation

## 2016-02-03 DIAGNOSIS — E118 Type 2 diabetes mellitus with unspecified complications: Secondary | ICD-10-CM | POA: Diagnosis not present

## 2016-02-03 DIAGNOSIS — R079 Chest pain, unspecified: Secondary | ICD-10-CM | POA: Diagnosis not present

## 2016-02-03 DIAGNOSIS — I10 Essential (primary) hypertension: Secondary | ICD-10-CM

## 2016-02-03 DIAGNOSIS — R55 Syncope and collapse: Secondary | ICD-10-CM

## 2016-02-03 MED ORDER — LABETALOL HCL 100 MG PO TABS: 100.0000 mg | ORAL_TABLET | ORAL | 3 refills | Status: DC

## 2016-02-03 NOTE — Patient Instructions (Signed)
Medication Instructions:  1. INCREASE LABETALOL TO 200 MG IN THE MORNING AND 100 MG IN THE PM; NEW RX HAS BEEN  SENT IN  Labwork: NONE  Testing/Procedures: 1. Your physician has requested that you have an echocardiogram. Echocardiography is a painless test that uses sound waves to create images of your heart. It provides your doctor with information about the size and shape of your heart and how well your heart's chambers and valves are working. This procedure takes approximately one hour. There are no restrictions for this procedure.  2. Your physician has requested that you have en exercise stress myoview. For further information please visit [email or web address]. Please follow instruction sheet, as given.  3. Your physician has recommended that you wear an event monitor. Event monitors are medical devices that record the heart's electrical activity. Doctors most often us these monitors to diagnose arrhythmias. Arrhythmias are problems with the speed or rhythm of the heartbeat. The monitor is a small, portable device. You can wear one while you do your normal daily activities. This is usually used to diagnose what is causing palpitations/syncope (passing out).    Follow-Up: DR. Clifton JamesMCALHANY IN 6 WEEKS   Any Other Special Instructions Will Be Listed Below (If Applicable).     If you need a refill on your cardiac medications before your next appointment, please call your pharmacy.

## 2016-02-03 NOTE — Progress Notes (Signed)
Cardiology Office Note    Date:  02/03/2016   ID:  Stacie Keller, DOB 1980-04-30, MRN [id number]  PCP:  Dr. Deloris Ping Cardiologist: New  Chief Complaint  Patient presents with  . Chest Pain  . Loss of Consciousness    History of Present Illness:  Stacie Keller is a 35 y.o. female referred to [place] from the emergency room with 4-5 syncopal episodes over the past 4-5 months. Has had some chest discomfort with prior episodes as well as with running around with her 68-year-old and exerting herself in any way. Her mother had several MIs first in her 2s. Patient saw a cardiologist at [facility] in August that was attributed to anxiety. She did not go through with a stress test or Holter monitor. While talking to her therapist on 01/17/16 she became lightheaded, short of breath but did not pass out. Later that day while checking her dog and the pet [place] she passed out for 2 minutes. She felt lightheaded, nauseous, sweaty. prior to the episode. No chest pain with his this.  were normal.  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  Records reviewed from cardiologist Dr. Kathlene Cote visit at [facility] 10/25/15. It was felt she had atypical chest pain but multiple risk factors including family history of premature CAD, DM 2, and hypertension. They recommended a PET myocardial perfusion scan and echo as well as monitor and lipid panel. Syncope was likely vasovagal.  Patient says she's comlpletely exhausted and has chest tightness with very little activity. One episode she was making dinner, developed chest tightness and passed out.Feels her heart racing but not skipping. This summer was the worst when it's hot and she tries to exert herself, she gets chest pain, dizziness, short of breath. She says it's not necessarily related to her stress and anxiety but it could be.   Past Medical History:  Diagnosis Date  . Anxiety   . Asthma   .  Depression   . Diabetes mellitus without complication (HCC)   . Migraines   . Thyroid disease     Past Surgical History:  Procedure Laterality Date  . APPENDECTOMY    . CESAREAN SECTION     x2  . CHOLECYSTECTOMY N/A 02/05/2015   Procedure: LAPAROSCOPIC CHOLECYSTECTOMY WITH INTRAOPERATIVE CHOLANGIOGRAM;  Surgeon: Lattie Haw, MD;  Location: [facility] ORS;  Service: General;  Laterality: N/A;  . EYE SURGERY      Current Medications: Outpatient Medications Prior to Visit  Medication Sig Dispense Refill  . cetirizine (ZYRTEC) 10 MG tablet Take 1 tablet by mouth daily.  11  . ibuprofen (ADVIL,MOTRIN) 200 MG tablet Take 600 mg by mouth every 6 (six) hours as needed for headache.    . Ipratropium-Albuterol (COMBIVENT RESPIMAT) 20-100 MCG/ACT AERS respimat Inhale 1 puff into the lungs every 6 (six) hours as needed for wheezing or shortness of breath.     . levothyroxine (SYNTHROID, LEVOTHROID) 200 MCG tablet Take 200 mcg by mouth daily before breakfast. NAME BRAND ONLY    . topiramate (TOPAMAX) 25 MG tablet Take 50 mg by mouth at bedtime.    Marland Kitchen labetalol (NORMODYNE) 200 MG tablet Take 100 mg by mouth 2 (two) times daily.     . fluticasone (FLONASE) 50 MCG/ACT nasal spray Place 2 sprays into both nostrils daily. (Patient not taking: Reported on 01/17/2016) 16 g 2  . predniSONE (DELTASONE) 10 MG tablet Take 6 tablets  today, on day 2  take 5 tablets, day 3 take 4 tablets, day 4 take 3 tablets, day 5 take  2 tablets and 1 tablet the last day (Patient not taking: Reported on 01/17/2016) 21 tablet 0  . promethazine (PHENERGAN) 25 MG tablet Take 1 tablet (25 mg total) by mouth every 6 (six) hours as needed for nausea or vomiting. (Patient not taking: Reported on 01/17/2016) 20 tablet 0   No facility-administered medications prior to visit.      Allergies:   Hydrocodone and Latex   Social History   Social History  . Marital status: Married    Spouse name: N/A  . Number of children: N/A  . Years  of education: N/A   Social History Main Topics  . Smoking status: Never Smoker  . Smokeless tobacco: Never Used  . Alcohol use No  . Drug use: No  . Sexual activity: Yes   Other Topics Concern  . None   Social History Narrative  . None     Family History:  The patient's family history includes Asthma in her mother; Breast cancer (age of onset: 5535) in her paternal grandmother; COPD in her mother; Heart disease in her mother; Hypertension in her mother.   ROS:   Please see the history of present illness.    Review of Systems  Constitution: Positive for weakness and malaise/fatigue.  HENT: Negative.   Eyes: Negative.        Legally blind  Cardiovascular: Positive for chest pain, dyspnea on exertion and syncope.  Respiratory: Negative.   Hematologic/Lymphatic: Negative.   Musculoskeletal: Negative.  Negative for joint pain.  Gastrointestinal: Negative.   Genitourinary: Negative.   Neurological: Positive for dizziness.  Psychiatric/Behavioral: The patient is nervous/anxious.    All other systems reviewed and are negative.   PHYSICAL EXAM:   VS:  BP 133/82 (BP Location: Left Arm, Patient Position: Supine, Cuff Size: Large)   Pulse 79   Ht 5\' 7"  (1.702 m)   Wt 259 lb 6.4 oz (117.7 kg)   BMI 40.63 kg/m   Physical Exam  GEN: Obese, in no acute distress  Neck: no JVD, carotid bruits, or masses Cardiac:RRR; no murmurs, rubs, or gallops  Respiratory:  clear to auscultation bilaterally, normal work of breathing GI: soft, nontender, nondistended, + BS Ext: without cyanosis, clubbing, or edema, Good distal pulses bilaterally MS: no deformity or atrophy  Skin: warm and dry, no rash Psych: euthymic mood, full affect  Wt Readings from Last 3 Encounters:  02/03/16 259 lb 6.4 oz (117.7 kg)  09/15/15 256 lb (116.1 kg)  07/25/15 257 lb (116.6 kg)      Studies/Labs Reviewed:   EKG:  EKG is  ordered today.  The ekg ordered today demonstrates Normal sinus rhythm, normal  EKG  Recent Labs: 02/06/2015: ALT 29 01/17/2016: BUN 9; Creatinine, Ser 0.93; Hemoglobin 12.7; Platelets 320; Potassium 4.2; Sodium 137   Lipid Panel No results found for: CHOL, TRIG, HDL, CHOLHDL, VLDL, LDLCALC, LDLDIRECT  Additional studies/ records that were reviewed today include:  Labs reviewed from emergency room are stable    ASSESSMENT:    1. Syncope, unspecified syncope type   2. Chest pain, unspecified type   3. Benign hypertension   4. Type 2 diabetes mellitus with complication, without long-term current use of insulin (HCC)   5. Family history of early CAD      PLAN:  In order of problems listed above:  Syncope patient has had 4-5 episodes over the past 5 months. Probably vasovagal.  All but the last were related to chest tightness associated with rapid heart rate, dyspnea and dizziness. She is not orthostatic in the office today. Actually her blood pressure is high. Recommend 2-D echo, event recorder and exercise Myoview. Follow-up with me after testing and cardiologist if necessary. Discussed with Dr. Clifton JamesMcAlhany. Patient is legally blind but says she can walk on the treadmill.  Pain with activity. Will order exercise Myoview to rule out ischemia.  Benign hypertension patient's Normodyne was recently decreased by primary care. Blood pressure is up today. Increase Normodyne to 200 mg in the morning 100 in the evening. May have to be increased further. 2 g sodium diet  Diabetes mellitus needs tighter control  Family history of early CAD screening stress Myoview    Medication Adjustments/Labs and Tests Ordered: Current medicines are reviewed at length with the patient today.  Concerns regarding medicines are outlined above.  Medication changes, Labs and Tests ordered today are listed in the Patient Instructions below. Patient Instructions  Medication Instructions:  1. INCREASE LABETALOL TO 200 MG IN THE MORNING AND 100 MG IN THE PM; NEW RX HAS BEEN  SENT  IN  Labwork: NONE  Testing/Procedures: 1. Your physician has requested that you have an echocardiogram. Echocardiography is a painless test that uses sound waves to create images of your heart. It provides your doctor with information about the size and shape of your heart and how well your heart's chambers and valves are working. This procedure takes approximately one hour. There are no restrictions for this procedure.  2. Your physician has requested that you have en exercise stress myoview. For further information please visit [email or web address]. Please follow instruction sheet, as given.  3. Your physician has recommended that you wear an event monitor. Event monitors are medical devices that record the heart's electrical activity. Doctors most often us these monitors to diagnose arrhythmias. Arrhythmias are problems with the speed or rhythm of the heartbeat. The monitor is a small, portable device. You can wear one while you do your normal daily activities. This is usually used to diagnose what is causing palpitations/syncope (passing out).    Follow-Up: DR. Clifton JamesMCALHANY IN 6 WEEKS   Any Other Special Instructions Will Be Listed Below (If Applicable).     If you need a refill on your cardiac medications before your next appointment, please call your pharmacy.      Elson ClanSigned, Maila Dukes, PA-C  02/03/2016 12:28 PM    [facility] [address], [place], [place]  [id number] Phone: [phone]; Fax: [phone]

## 2016-02-10 ENCOUNTER — Telehealth (HOSPITAL_COMMUNITY): Payer: Self-pay | Admitting: *Deleted

## 2016-02-10 NOTE — Telephone Encounter (Signed)
Patient given detailed instructions per Myocardial Perfusion Study Information Sheet for the test on 02/12/16 at 1230. Patient notified to arrive 15 minutes early and that it is imperative to arrive on time for appointment to keep from having the test rescheduled.  If you need to cancel or reschedule your appointment, please call the office within 24 hours of your appointment. Failure to do so may result in a cancellation of your appointment, and a $50 no show fee. Patient verbalized understanding.Donnamarie Shankles, Adelene IdlerCynthia W

## 2016-02-12 ENCOUNTER — Ambulatory Visit (INDEPENDENT_AMBULATORY_CARE_PROVIDER_SITE_OTHER): Payer: Medicaid Other

## 2016-02-12 ENCOUNTER — Ambulatory Visit (HOSPITAL_COMMUNITY): Payer: Medicaid Other | Attending: Cardiology

## 2016-02-12 DIAGNOSIS — R55 Syncope and collapse: Secondary | ICD-10-CM

## 2016-02-12 DIAGNOSIS — R079 Chest pain, unspecified: Secondary | ICD-10-CM | POA: Diagnosis not present

## 2016-02-12 MED ORDER — TECHNETIUM TC 99M TETROFOSMIN IV KIT
32.6000 | PACK | Freq: Once | INTRAVENOUS | Status: AC | PRN
Start: 2016-02-12 — End: 2016-02-12
  Administered 2016-02-12: 32.6 via INTRAVENOUS
  Filled 2016-02-12: qty 33

## 2016-02-13 ENCOUNTER — Ambulatory Visit (HOSPITAL_COMMUNITY): Payer: No Typology Code available for payment source | Attending: Cardiology

## 2016-02-13 LAB — MYOCARDIAL PERFUSION IMAGING
CHL CUP NUCLEAR SDS: 5
CHL CUP NUCLEAR SSS: 5
CHL CUP RESTING HR STRESS: 100 {beats}/min
CSEPED: 4 min
CSEPEDS: 51 s
CSEPHR: 93 %
Estimated workload: 6.8 METS
LHR: 0.29
LV dias vol: 58 mL (ref 46–106)
LV sys vol: 21 mL
MPHR: 183 {beats}/min
NUC STRESS TID: 0.73
Peak HR: 171 {beats}/min
SRS: 0

## 2016-02-13 MED ORDER — TECHNETIUM TC 99M TETROFOSMIN IV KIT
32.9000 | PACK | Freq: Once | INTRAVENOUS | Status: AC | PRN
  Administered 2016-02-13: 32.9 via INTRAVENOUS
  Filled 2016-02-13: qty 33

## 2016-02-17 ENCOUNTER — Telehealth: Payer: Self-pay | Admitting: Cardiology

## 2016-02-17 NOTE — Telephone Encounter (Signed)
-----   Message from Dyann KiefMichele M Lenze, PA-C sent at 02/17/2016  7:57 AM EST ----- Normal stress myoview

## 2016-02-17 NOTE — Telephone Encounter (Signed)
Stacie Keller is calling in reference to her test results . Please call   Thanks

## 2016-02-17 NOTE — Telephone Encounter (Signed)
Pt returned my call and she has been made aware of her stress test results. She verbalized understanding.

## 2016-02-17 NOTE — Telephone Encounter (Signed)
Looks as though Victorino DikeJennifer called this pt with results.  Will forward to her for f/u.

## 2016-02-25 ENCOUNTER — Ambulatory Visit (HOSPITAL_COMMUNITY): Payer: Medicaid Other | Attending: Internal Medicine

## 2016-02-25 ENCOUNTER — Other Ambulatory Visit: Payer: Self-pay

## 2016-02-25 DIAGNOSIS — R079 Chest pain, unspecified: Secondary | ICD-10-CM | POA: Diagnosis not present

## 2016-02-25 DIAGNOSIS — E119 Type 2 diabetes mellitus without complications: Secondary | ICD-10-CM | POA: Diagnosis not present

## 2016-02-25 DIAGNOSIS — Z8249 Family history of ischemic heart disease and other diseases of the circulatory system: Secondary | ICD-10-CM | POA: Insufficient documentation

## 2016-02-25 DIAGNOSIS — E669 Obesity, unspecified: Secondary | ICD-10-CM | POA: Diagnosis not present

## 2016-02-25 DIAGNOSIS — I10 Essential (primary) hypertension: Secondary | ICD-10-CM | POA: Diagnosis not present

## 2016-02-25 DIAGNOSIS — Z6841 Body Mass Index (BMI) 40.0 and over, adult: Secondary | ICD-10-CM | POA: Insufficient documentation

## 2016-02-25 DIAGNOSIS — E785 Hyperlipidemia, unspecified: Secondary | ICD-10-CM | POA: Insufficient documentation

## 2016-03-18 ENCOUNTER — Ambulatory Visit: Payer: Medicaid Other | Admitting: Cardiology

## 2016-03-19 ENCOUNTER — Ambulatory Visit: Payer: Medicaid Other | Admitting: Cardiology

## 2016-04-02 ENCOUNTER — Encounter (INDEPENDENT_AMBULATORY_CARE_PROVIDER_SITE_OTHER): Payer: Self-pay

## 2016-04-02 ENCOUNTER — Encounter: Payer: Self-pay | Admitting: Cardiology

## 2016-04-02 ENCOUNTER — Ambulatory Visit (INDEPENDENT_AMBULATORY_CARE_PROVIDER_SITE_OTHER): Payer: Medicaid Other | Admitting: Cardiology

## 2016-04-02 VITALS — BP 120/80 | HR 88 | Ht 68.0 in | Wt 264.4 lb

## 2016-04-02 DIAGNOSIS — R61 Generalized hyperhidrosis: Secondary | ICD-10-CM

## 2016-04-02 DIAGNOSIS — R5383 Other fatigue: Secondary | ICD-10-CM | POA: Diagnosis not present

## 2016-04-02 DIAGNOSIS — R55 Syncope and collapse: Secondary | ICD-10-CM | POA: Diagnosis not present

## 2016-04-02 DIAGNOSIS — I10 Essential (primary) hypertension: Secondary | ICD-10-CM

## 2016-04-02 DIAGNOSIS — R000 Tachycardia, unspecified: Secondary | ICD-10-CM | POA: Diagnosis not present

## 2016-04-02 DIAGNOSIS — E118 Type 2 diabetes mellitus with unspecified complications: Secondary | ICD-10-CM

## 2016-04-02 NOTE — Patient Instructions (Signed)
Medication Instructions:  Your physician recommends that you continue on your current medications as directed. Please refer to the Current Medication list given to you today.   Labwork: Your physician recommends that you have lab work today: Free T 4/TSH/Cortisol   Testing/Procedures: -None  Follow-Up: Your physician recommends that you keep your schedule a follow-up appointment with Dr. Graciela HusbandsKlein.    Any Other Special Instructions Will Be Listed Below (If Applicable).     If you need a refill on your cardiac medications before your next appointment, please call your pharmacy.

## 2016-04-02 NOTE — Progress Notes (Signed)
Cardiology Office Note   Date:  04/02/2016   ID:  Stacie Keller, DOB [birth year], MRN [id number]  PCP:  Pcp Not In System  Cardiologist:  Dr. Anne Fu     Chief Complaint  Patient presents with  . Tachycardia    monitor results      History of Present Illness: Stacie Keller is a 36 y.o. female who presents for follow up of syncope.  She has had 4-5 episodes over the past 5 months.  Probably vasovagal.  Most with chest tightness.  Echo, event recorder and exercise myoview ordered.  Pt has premature CAD in her mother.   Orthostatic BPs in Dec were neg.    Event monitor ST 105-120, no afib, no Vt. No PVCs and no pauses.  myoview was normal with EF 63% and no EKG changes and no ST segment deviation, no ischemia.  Echo with EF 65-70% normal Echo.   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  Records reviewed from cardiologist Dr. Kathlene Cote visit at [facility] 10/25/15. It was felt she had atypical chest pain but multiple risk factors family history of premature CAD, DM 2, and hypertension. They recommended a PET myocardial perfusion scan and echo as well as monitor and lipid panel. Syncope was likely vasovagal.  Today she is very frustrated with fatigue and another episode of syncope, brief.  She did not have syncope on the monitor.   She feels she does not have a life.      Past Medical History:  Diagnosis Date  . Anxiety   . Asthma   . Depression   . Diabetes mellitus without complication (HCC)   . Migraines   . Thyroid disease     Past Surgical History:  Procedure Laterality Date  . APPENDECTOMY    . CESAREAN SECTION     x2  . CHOLECYSTECTOMY N/A 02/05/2015   Procedure: LAPAROSCOPIC CHOLECYSTECTOMY WITH INTRAOPERATIVE CHOLANGIOGRAM;  Surgeon: Lattie Haw, MD;  Location: [facility] ORS;  Service: General;  Laterality: N/A;  . EYE SURGERY       Current Outpatient Prescriptions  Medication Sig Dispense Refill  .  cetirizine (ZYRTEC) 10 MG tablet Take 1 tablet by mouth daily.  11  . ibuprofen (ADVIL,MOTRIN) 200 MG tablet Take 600 mg by mouth every 6 (six) hours as needed for headache.    . Ipratropium-Albuterol (COMBIVENT RESPIMAT) 20-100 MCG/ACT AERS respimat Inhale 1 puff into the lungs every 6 (six) hours as needed for wheezing or shortness of breath.     . labetalol (NORMODYNE) 100 MG tablet Take 1 tablet (100 mg total) by mouth as directed. (2 tabs) 200 mg in the AM and (1 tab) 100 mg in the pm 270 tablet 3  . levothyroxine (SYNTHROID, LEVOTHROID) 200 MCG tablet Take 200 mcg by mouth daily before breakfast. NAME BRAND ONLY    . LORazepam (ATIVAN) 0.5 MG tablet Take 0.5 mg by mouth every 6 (six) hours as needed for anxiety.    . topiramate (TOPAMAX) 25 MG tablet Take 50 mg by mouth at bedtime.     No current facility-administered medications for this visit.     Allergies:   Hydrocodone and Latex    Social History:  The patient  reports that she has never smoked. She has never used smokeless tobacco. She reports that she does not drink alcohol or use drugs.   Family History:  The patient's family history includes Asthma in her mother; Breast  cancer (age of onset: 8435) in her paternal grandmother; COPD in her mother; Heart disease in her mother; Hypertension in her mother.    ROS:  General:no colds or fevers, no weight changes Skin:no rashes or ulcers HEENT:no blurred vision, no congestion CV:see HPI PUL:see HPI GI:no diarrhea constipation or melena, no indigestion GU:no hematuria, no dysuria MS:no joint pain, no claudication Neuro:no syncope, no lightheadedness Endo:pre- diabetes, + thyroid disease  Wt Readings from Last 3 Encounters:  04/02/16 264 lb 6.4 oz (119.9 kg)  02/12/16 259 lb (117.5 kg)  02/03/16 259 lb 6.4 oz (117.7 kg)     PHYSICAL EXAM: VS:  BP 120/80   Pulse 88   Ht 5\' 8"  (1.727 m)   Wt 264 lb 6.4 oz (119.9 kg)   BMI 40.20 kg/m  , BMI Body mass index is 40.2  kg/m. General:Pleasant affect, NAD Skin:Warm and dry, brisk capillary refill HEENT:normocephalic, sclera clear, mucus membranes moist Neck:supple, no JVD, no bruits  Heart:S1S2 RRR without murmur, gallup, rub or click Lungs:clear without rales, rhonchi, or wheezes ZOX:WRUEAbd:soft, non tender, + BS, do not palpate liver spleen or masses Ext:no lower ext edema, 2+ pedal pulses, 2+ radial pulses Neuro:alert and oriented X 3, MAE, follows commands, + facial symmetry  BP lying  137/81 P 78 Sitting 135/84     P 88 Standing  135/80   P 90 3 min standing 124/81   P 90  EKG:  EKG is NOT ordered today.   Recent Labs: 01/17/2016: BUN 9; Creatinine, Ser 0.93; Hemoglobin 12.7; Platelets 320; Potassium 4.2; Sodium 137    Lipid Panel No results found for: CHOL, TRIG, HDL, CHOLHDL, VLDL, LDLCALC, LDLDIRECT     Other studies Reviewed: Additional studies/ records that were reviewed today include: . Nuc study Study Highlights    Nuclear stress EF: 63%.  Blood pressure demonstrated a hypertensive response to exercise.  There was no ST segment deviation noted during stress.  Normal perfusion No ischemia  This is a low risk study.     Echo: Study Conclusions  - Left ventricle: The cavity size was normal. Wall thickness was   normal. Systolic function was vigorous. The estimated ejection   fraction was in the range of 65% to 70%. Wall motion was normal;   there were no regional wall motion abnormalities. Left   ventricular diastolic function parameters were normal. - Left atrium: The atrium was normal in size. - Inferior vena cava: The vessel was normal in size. The   respirophasic diameter changes were in the normal range (>= 50%),   consistent with normal central venous pressure.  Impressions:  - Normal study.  ASSESSMENT AND PLAN:  1.  Syncope with normal Echo, normal nuc study done for chest pressure and event monitor without a fib or VT.  HR does go to 120 and this may be  some of her racing HR.   Previous orthostatic BP were stable.  Today no orthostasis and some increase of HR.    Discussed with Dr. Eden EmmsNishan DOD and pt is on BB, this may increasing her fatigue.  May do better on bystolic.  But Dr. Eden EmmsNishan recommends Dr. Graciela HusbandsKlein to eval for POTS.  Additionally she did not have episode on monitor ? Need for Linq?   wil ask her to wear support stockings for now.  Drink fluids, increase salt.   -pt has not been able to do her normal activities due to this fatigue, syncope and tachycardia  Also with increased diaphoresis.  Check  thyroid today and cortisol.   2.    Chest pain neg nuc study  3. HTN controlled  4. DM-2 no meds she was on metformin but now her hgb A1C was normal so stopped per PCP  5. FH of premature CAD  6. Hx migraines  7.  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   8.  Hx of asthma.   9. Depression/PTSD hx of panic attacks.   This may be playing a role. But would prefer to rule out other causes.   Current medicines are reviewed with the patient today.  The patient Has no concerns regarding medicines.  The following changes have been made:  See above Labs/ tests ordered today include:see above  Disposition:   FU:  see above  Signed, Nada Boozer, NP  04/02/2016 1:32 PM    [facility] [address] [place], [place], [place]  [address]/ [address] [place], [place] Phone: [phone]; Fax: [phone]  [phone]

## 2016-04-03 LAB — CORTISOL: Cortisol: 6.5 ug/dL

## 2016-04-03 LAB — T4, FREE: FREE T4: 1.81 ng/dL — AB (ref 0.82–1.77)

## 2016-04-03 LAB — TSH: TSH: 0.855 u[IU]/mL (ref 0.450–4.500)

## 2016-04-16 ENCOUNTER — Encounter: Payer: Self-pay | Admitting: Internal Medicine

## 2016-04-16 ENCOUNTER — Ambulatory Visit (INDEPENDENT_AMBULATORY_CARE_PROVIDER_SITE_OTHER): Payer: Medicaid Other | Admitting: Internal Medicine

## 2016-04-16 VITALS — HR 96 | Ht 69.0 in | Wt 268.0 lb

## 2016-04-16 DIAGNOSIS — R55 Syncope and collapse: Secondary | ICD-10-CM

## 2016-04-16 MED ORDER — METOPROLOL SUCCINATE ER 50 MG PO TB24: 50.0000 mg | ORAL_TABLET | Freq: Every day | ORAL | 3 refills | Status: DC

## 2016-04-16 NOTE — Patient Instructions (Addendum)
Medication Instructions:  Your physician has recommended you make the following change in your medication:  1) STOP Labetalol 2) START Toprol XL 50 mg daily    Labwork: None Ordered   Testing/Procedures: None Ordered   Follow-Up: Your physician recommends that you schedule a follow-up appointment in: 12 weeks with Dr. Graciela HusbandsKlein    Any Other Special Instructions Will Be Listed Below (If Applicable). Push fluids  Wear Spanx    If you need a refill on your cardiac medications before your next appointment, please call your pharmacy.

## 2016-04-16 NOTE — Progress Notes (Signed)
ELECTROPHYSIOLOGY CONSULT NOTE  Patient ID: Stacie Keller, MRN: [id number], DOB/AGE: [birth year] 38 y.o. Admit date: (Not on file) Date of Consult: 04/16/2016  Primary Physician: Pcp Not In System Primary Cardiologist: none s Consulting Physician self   Chief Complaint: spells   HPI Stacie Keller is a 36 y.o. female  Seen for complaints of exercise intolerance standing intolerance syncope and presyncope and heat intolerance oh which date back many many years but has been much worse since the summer. They are further been accompanied by profound fatigue. Her sleep is badly disturbed that she doesn't sleep during the day she thinks she is awake. Hours. Her mind seeing unable to be provided. One time she slept well in the last 3 or 4 months she had used NyQuil. She's never had a sleep study.  Her fluid status is modestly deplete. She is hypertensive. She has been on labetalol.  Evaluation has included an echocardiogram that was normal stress Myoview that was normal all accomplished 12/17. Her event recorder demonstrated frequent episodes of symptomatic tachycardia with heart rates in the 130s.  Her exercise test was notable for rapid increase in heart rates. She was recorded at tachycardia at a rate of 130 sitting and then 150 with minimal exercise. In this regard CBC and a thyroid testing recently was normal   She has significant mental health issues.   Past Medical History:  Diagnosis Date  . Anxiety   . Asthma   . Depression   . Diabetes mellitus without complication (HCC)   . Migraines   . Thyroid disease       Surgical History:  Past Surgical History:  Procedure Laterality Date  . APPENDECTOMY    . CESAREAN SECTION     x2  . CHOLECYSTECTOMY N/A 02/05/2015   Procedure: LAPAROSCOPIC CHOLECYSTECTOMY WITH INTRAOPERATIVE CHOLANGIOGRAM;  Surgeon: Lattie Haw, MD;  Location: [facility] ORS;  Service: General;  Laterality: N/A;  . EYE SURGERY       Home Meds: Prior to  Admission medications   Medication Sig Start Date End Date Taking? Authorizing Provider  cetirizine (ZYRTEC) 10 MG tablet Take 1 tablet by mouth daily. 01/21/15  Yes Historical Provider, MD  ibuprofen (ADVIL,MOTRIN) 200 MG tablet Take 600 mg by mouth every 6 (six) hours as needed for headache.   Yes Historical Provider, MD  Ipratropium-Albuterol (COMBIVENT RESPIMAT) 20-100 MCG/ACT AERS respimat Inhale 1 puff into the lungs every 6 (six) hours as needed for wheezing or shortness of breath.    Yes Historical Provider, MD  labetalol (NORMODYNE) 100 MG tablet Take 1 tablet (100 mg total) by mouth as directed. (2 tabs) 200 mg in the AM and (1 tab) 100 mg in the pm 02/03/16  Yes Dyann Kief, PA-C  levothyroxine (SYNTHROID, LEVOTHROID) 200 MCG tablet Take 200 mcg by mouth daily before breakfast. NAME BRAND ONLY   Yes Historical Provider, MD  LORazepam (ATIVAN) 0.5 MG tablet Take 0.5 mg by mouth every 6 (six) hours as needed for anxiety.   Yes Historical Provider, MD  topiramate (TOPAMAX) 25 MG tablet Take 50 mg by mouth at bedtime. 08/27/15  Yes Historical Provider, MD    Allergies:  Allergies  Allergen Reactions  . Hydrocodone Nausea And Vomiting  . Latex Hives, Itching and Swelling    Social History   Social History  . Marital status: Married    Spouse name: N/A  . Number of children: N/A  . Years of education: N/A   Occupational History  .  Not on file.   Social History Main Topics  . Smoking status: Never Smoker  . Smokeless tobacco: Never Used  . Alcohol use No  . Drug use: No  . Sexual activity: Yes   Other Topics Concern  . Not on file   Social History Narrative  . No narrative on file     Family History  Problem Relation Age of Onset  . Asthma Mother   . COPD Mother   . Heart disease Mother   . Hypertension Mother   . Breast cancer Paternal Grandmother 35     ROS:  Please see the history of present illness.     All other systems reviewed and negative.     Physical Exam: Pulse 96, height 5\' 9"  (1.753 m), weight 268 lb (121.6 kg), SpO2 98 %. General: Well developed.morbdily obses d female in no acute distress. Head: Normocephalic, atraumatic, sclera non-icteric, no xanthomas, nares are without discharge. EENT: normal poor vision Lymph Nodes:  none Neck: Negative for carotid bruits. JVD not elevated. Back:without scoliosis kyphosis Lungs: Clear bilaterally to auscultation without wheezes, rales, or rhonchi. Breathing is unlabored. Heart: RRR with S1 S2. 2/6 murmur . No rubs, or gallops appreciated. Abdomen: Soft, non-tender, non-distended with normoactive bowel sounds. No hepatomegaly. No rebound/guarding. No obvious abdominal masses. Msk:  Strength and tone appear normal for age. Extremities: No clubbing or cyanosis. No edema.  Distal pedal pulses are 2+ and equal bilaterally. Skin: Warm and Dry Neuro: Alert and oriented X 3. CN III-XII intact Grossly normal sensory and motor function . Psych:  Responds to questions appropriately with a normal affect.      Labs: Cardiac Enzymes No results for input(s): CKTOTAL, CKMB, TROPONINI in the last 72 hours. CBC Lab Results  Component Value Date   WBC 10.3 01/17/2016   HGB 12.7 01/17/2016   HCT 38.5 01/17/2016   MCV 74.9 (L) 01/17/2016   PLT 320 01/17/2016   PROTIME: No results for input(s): LABPROT, INR in the last 72 hours. Chemistry No results for input(s): NA, K, CL, CO2, BUN, CREATININE, CALCIUM, PROT, BILITOT, ALKPHOS, ALT, AST, GLUCOSE in the last 168 hours.  Invalid input(s): LABALBU Lipids No results found for: CHOL, HDL, LDLCALC, TRIG BNP No results found for: PROBNP Thyroid Function Tests: No results for input(s): TSH, T4TOTAL, T3FREE, THYROIDAB in the last 72 hours.  Invalid input(s): FREET3 Miscellaneous No results found for: DDIMER  Radiology/Studies:  No results found.  EKG:  Sinus rhythm at 86 Intervals 15/09/38 Normal   Assessment and Plan:  * PCOS  Tachycardia  Sinus  Obesity  depression  PTSD  Chest pain    The patient has a complex series of symptoms some of which I understand is some which I don't. They include heat intolerance and shower intolerance,   standing intolerance all suggest autonomic dysfunction. Objectively, her exercise test had very rapid changes in rate and her event recorder had numerous episodes of sinus tachycardia suggesting either adrenaline hypersensitivity or excessive catecholamines.  She also has profound sleep deprivation. She may well be depressed in the context of her PTSD and these mental health issues certainly interface with autonomic dysfunction and both need to be addressed in parallel.  We discussed extensively the issues of dysautonomia, the physiology of orthstasis and positional stress.  We discussed the role of salt and water repletion, the importance of exercise, often needing to be started in the recumbent position, and the awareness of triggers and the role of ambient heat and dehydration  I suggested that she shower at night, that she use compressive clothing and she be much more aggressive in her volume repletion.  We will also discontinue the labetalol which has an alpha-blocker. We will try and use metoprolol. There may also be a role for clonidine as a central sympatholytic  Stressed the importance of ongoing efforts for relief of mental illness    Sherryl Manges

## 2016-04-27 ENCOUNTER — Institutional Professional Consult (permissible substitution): Payer: Medicaid Other | Admitting: Internal Medicine

## 2016-07-15 ENCOUNTER — Encounter: Payer: Self-pay | Admitting: Internal Medicine

## 2016-07-15 ENCOUNTER — Ambulatory Visit (INDEPENDENT_AMBULATORY_CARE_PROVIDER_SITE_OTHER): Payer: Medicaid Other | Admitting: Internal Medicine

## 2016-07-15 VITALS — BP 106/78 | HR 79 | Ht 68.0 in | Wt 252.0 lb

## 2016-07-15 DIAGNOSIS — R55 Syncope and collapse: Secondary | ICD-10-CM

## 2016-07-15 DIAGNOSIS — R000 Tachycardia, unspecified: Secondary | ICD-10-CM

## 2016-07-15 MED ORDER — METOPROLOL SUCCINATE ER 50 MG PO TB24: ORAL_TABLET | ORAL | 3 refills | Status: DC

## 2016-07-15 NOTE — Patient Instructions (Signed)
Medication Instructions: - Your physician has recommended you make the following change in your medication:  1) Increase metoprolol succinate 50 mg- take 1 & 1/2 tablets (75 mg) by mouth once daily  Labwork: - none ordered  Procedures/Testing: - none ordered  Follow-Up: - Your physician wants you to follow-up in: 6 months with Dr. Graciela HusbandsKlein. You will receive a reminder letter in the mail two months in advance. If you don't receive a letter, please call our office to schedule the follow-up appointment.   Any Additional Special Instructions Will Be Listed Below (If Applicable).     If you need a refill on your cardiac medications before your next appointment, please call your pharmacy.

## 2016-07-15 NOTE — Progress Notes (Signed)
Patient Care Team: System, Pcp Not In as PCP - General (General Practice)   HPI  Stacie Keller is a 36 y.o. female Seen in follow-up for conscious complex constellation of symptoms including orthostatic and heat intolerance. She is some degree of resting tachycardia and inappropriate response to activity.   She has significant sleep disturbance.    She has complex mental illness; PCO S  She is significantly improved on the fluid repletion and metoprolol. She is also working on a low-carb diet and weight loss.  She had a day a couple of months ago where she had recurrent syncope for couple of days in row. She has very little memory of the events except that she does remember her friend telling him that she was out for 2 hours 1 time.     Past Medical History:  Diagnosis Date  . Anxiety   . Asthma   . Depression   . Diabetes mellitus without complication (HCC)   . Migraines   . Thyroid disease     Past Surgical History:  Procedure Laterality Date  . APPENDECTOMY    . CESAREAN SECTION     x2  . CHOLECYSTECTOMY N/A 02/05/2015   Procedure: LAPAROSCOPIC CHOLECYSTECTOMY WITH INTRAOPERATIVE CHOLANGIOGRAM;  Surgeon: Lattie Haw, MD;  Location: [facility] ORS;  Service: General;  Laterality: N/A;  . EYE SURGERY      Current Outpatient Prescriptions  Medication Sig Dispense Refill  . cetirizine (ZYRTEC) 10 MG tablet Take 1 tablet by mouth daily.  11  . ibuprofen (ADVIL,MOTRIN) 200 MG tablet Take 600 mg by mouth every 6 (six) hours as needed for headache.    . Ipratropium-Albuterol (COMBIVENT RESPIMAT) 20-100 MCG/ACT AERS respimat Inhale 1 puff into the lungs every 6 (six) hours as needed for wheezing or shortness of breath.     . levothyroxine (SYNTHROID, LEVOTHROID) 200 MCG tablet Take 200 mcg by mouth daily before breakfast. NAME BRAND ONLY    . LORazepam (ATIVAN) 0.5 MG tablet Take 0.5 mg by mouth every 6 (six) hours as needed for anxiety.    . metoprolol succinate  (TOPROL-XL) 50 MG 24 hr tablet Take 1 tablet (50 mg total) by mouth daily. Take with or immediately following a meal. 30 tablet 3  . topiramate (TOPAMAX) 25 MG tablet Take 50 mg by mouth 2 (two) times daily.      No current facility-administered medications for this visit.     Allergies  Allergen Reactions  . Hydrocodone Nausea And Vomiting  . Latex Hives, Itching and Swelling      Review of Systems negative except from HPI and PMH  Physical Exam BP 106/78   Pulse 79   Ht 5\' 8"  (1.727 m)   Wt 252 lb (114.3 kg)   SpO2 98%   BMI 38.32 kg/m  Well developed and nourished in no acute distress HENT normal Neck supple with JVP-flat Clear Regular rate and rhythm, no murmurs or gallops Abd-soft with active BS No Clubbing cyanosis edema Skin-warm and dry A & Oriented  Grossly normal sensory and motor function  No ECG  Assessment and  Plan  PCOS  Tachycardia  Sinus  Obesity  depression  PTSD  Chest pain   she is significantly improved on the selective beta blocker. We will increase it gently from 50--75. Her blood pressures also vastly improved. Hence, we will give her salt supplementation and I've suggested ThermaTabs,  VIpotassium, and NUUN  she will look into the cost and take  1 or 2 pills twice a day.   Her menses are modest. She will looked and try to associate symptoms with her menses.  She is working on weight loss. I've encouraged this.  She is concerning for mental health.  We spent more than 50% of our >25 min visit in face to face counseling regarding the above           Current medicines are reviewed at length with the patient today .  The patient does not  have concerns regarding medicines.

## 2016-08-10 ENCOUNTER — Ambulatory Visit (INDEPENDENT_AMBULATORY_CARE_PROVIDER_SITE_OTHER): Payer: Medicaid Other | Admitting: Certified Nurse Midwife

## 2016-08-10 ENCOUNTER — Encounter: Payer: Self-pay | Admitting: Certified Nurse Midwife

## 2016-08-10 VITALS — BP 114/78 | HR 82 | Ht 67.5 in | Wt 247.0 lb

## 2016-08-10 DIAGNOSIS — N898 Other specified noninflammatory disorders of vagina: Secondary | ICD-10-CM

## 2016-08-10 DIAGNOSIS — Z8742 Personal history of other diseases of the female genital tract: Secondary | ICD-10-CM | POA: Diagnosis not present

## 2016-08-10 DIAGNOSIS — Z01411 Encounter for gynecological examination (general) (routine) with abnormal findings: Secondary | ICD-10-CM | POA: Diagnosis not present

## 2016-08-10 DIAGNOSIS — Z124 Encounter for screening for malignant neoplasm of cervix: Secondary | ICD-10-CM | POA: Diagnosis not present

## 2016-08-10 DIAGNOSIS — Z0000 Encounter for general adult medical examination without abnormal findings: Secondary | ICD-10-CM | POA: Diagnosis not present

## 2016-08-10 NOTE — Progress Notes (Signed)
ANNUAL PREVENTATIVE CARE GYN  ENCOUNTER NOTE  Subjective:       Stacie Keller is a 36 y.o. [id number] female here for a routine annual gynecologic exam.    She desires pregnancy and is using a know sperm donor.   Denies difficulty breathing or respiratory distress, chest pain, abdominal pain, unexplained vaginal bleeding, dysuria, and leg pain or swelling.   History significant for preterm birth, Potts syndrome, thyroid disease, diabetes, asthma, anxiety/depression, and PCOS   Gynecologic History  Patient's last menstrual period was 07/19/2016.   Contraception: none; desires pregnancy  Last Pap: 2015. Results were: normal  Menstrual History  Period Cycle (Days): 28 Period Duration (Days): 4-5 Period Pattern: Regular Menstrual Flow: Moderate Dysmenorrhea: None  Obstetric History  OB History  Gravida Para Term Preterm AB Living  2 2   2   1   SAB TAB Ectopic Multiple Live Births          2    # Outcome Date GA Lbr Len/2nd Weight Sex Delivery Anes PTL Lv  2 Preterm 09/2013 [id number]  6 lb 10 oz (3.005 kg) F CS-Unspec   LIV  1 Preterm  [id number]  1 lb 12 oz (0.794 kg) M CS-Classical   DEC      Past Medical History:  Diagnosis Date  . Anxiety   . Asthma   . Depression   . Diabetes mellitus without complication (HCC)   . Migraines   . PCOS (polycystic ovarian syndrome)   . Pott's disease   . Thyroid disease     Past Surgical History:  Procedure Laterality Date  . APPENDECTOMY    . CESAREAN SECTION     x2  . CHOLECYSTECTOMY N/A 02/05/2015   Procedure: LAPAROSCOPIC CHOLECYSTECTOMY WITH INTRAOPERATIVE CHOLANGIOGRAM;  Surgeon: Lattie Hawichard E Cooper, MD;  Location: [facility] ORS;  Service: General;  Laterality: N/A;  . EYE SURGERY      Current Outpatient Prescriptions on File Prior to Visit  Medication Sig Dispense Refill  . Ipratropium-Albuterol (COMBIVENT RESPIMAT) 20-100 MCG/ACT AERS respimat Inhale 1 puff into the lungs every 6 (six) hours as needed for wheezing or shortness of  breath.     . levothyroxine (SYNTHROID, LEVOTHROID) 200 MCG tablet Take 200 mcg by mouth daily before breakfast. NAME BRAND ONLY    . metoprolol succinate (TOPROL-XL) 50 MG 24 hr tablet Take 1 & 1/2 tablets (75 mg) by mouth once daily 135 tablet 3  . LORazepam (ATIVAN) 0.5 MG tablet Take 0.5 mg by mouth every 6 (six) hours as needed for anxiety.     No current facility-administered medications on file prior to visit.     Allergies  Allergen Reactions  . Hydrocodone Nausea And Vomiting  . Latex Hives, Itching and Swelling  . Oxycodone Nausea And Vomiting    Social History   Social History  . Marital status: Married    Spouse name: N/A  . Number of children: N/A  . Years of education: N/A   Occupational History  . Not on file.   Social History Main Topics  . Smoking status: Never Smoker  . Smokeless tobacco: Never Used  . Alcohol use No  . Drug use: No  . Sexual activity: Yes    Birth control/ protection: None     Comment: Trying to conceive    Other Topics Concern  . Not on file   Social History Narrative  . No narrative on file    Family History  Problem Relation Age of Onset  . Asthma  Mother   . COPD Mother   . Heart disease Mother   . Hypertension Mother   . Diabetes Father   . Hypertension Father   . Breast cancer Paternal Grandmother 38    The following portions of the patient's history were reviewed and updated as appropriate: allergies, current medications, past family history, past medical history, past social history, past surgical history and problem list.  Review of Systems  ROS negative except as noted above. Information obtained from patient.    Objective:   BP 114/78 (BP Location: Left Arm, Patient Position: Sitting, Cuff Size: Large)   Pulse 82   Ht 5' 7.5" (1.715 m)   Wt 247 lb (112 kg)   LMP 07/19/2016   BMI 38.11 kg/m    CONSTITUTIONAL: Well-developed, well-nourished female in no acute distress.   PSYCHIATRIC: Normal mood and  affect. Normal behavior. Normal judgment and thought content.  NEUROLGIC: Alert and oriented to person, place, and time. Normal muscle tone coordination.   HENT:  Normocephalic, atraumatic, External right and left ear normal. Oropharynx is clear and moist  EYES: No scleral icterus.   NECK: Normal range of motion, supple, no masses.  Normal thyroid.   SKIN: Skin is warm and dry. No rash noted. Not diaphoretic. No erythema. No pallor.  CARDIOVASCULAR: Normal heart rate noted, regular rhythm, no murmur.  RESPIRATORY: Clear to auscultation bilaterally. Effort and breath sounds normal, no problems with respiration noted.  BREASTS: Symmetric in size. No masses, skin changes, nipple drainage, or lymphadenopathy.  ABDOMEN: Soft, normal bowel sounds, no distention noted.  No tenderness, rebound or guarding.   PELVIC:  External Genitalia: Normal  BUS: Normal  Vagina: Normal  Cervix: Normal  Uterus: Normal  Adnexa: Normal  MUSCULOSKELETAL: Normal range of motion. No tenderness.  No cyanosis, clubbing, or edema.  2+ distal pulses.  LYMPHATIC: No Axillary, Supraclavicular, or Inguinal Adenopathy.  Assessment:   Annual gynecologic examination 36 y.o.   Contraception: none   Obesity 2   Problem List Items Addressed This Visit    None    Visit Diagnoses    Screening for malignant neoplasm of cervix       Relevant Orders   Pap IG and HPV (high risk) DNA detection ([facility])   Screening for diabetes mellitus       Relevant Orders   Hemoglobin A1c   Encounter for gynecological examination with abnormal finding       Relevant Orders   TSH   CBC   Comprehensive metabolic panel   Pap IG and HPV (high risk) DNA detection ([facility])   Lipid panel   Hemoglobin A1c      Plan:   Pap: Pap Co Test  Labs: See orders.    Routine preventative health maintenance measures emphasized: Preconception care, Exercise/Diet/Weight control, Tobacco Warnings,  Alcohol/Substance use risks and Stress Management   RTC x 3 months if not pregnancy to discuss fertility options.  Return to Clinic - 1 Year for annual exam or sooner if needed.   Gunnar Bulla, CNM

## 2016-08-10 NOTE — Patient Instructions (Addendum)
Preventive Care 18-39 Years, Female Preventive care refers to lifestyle choices and visits with your health care provider that can promote health and wellness. What does preventive care include?  A yearly physical exam. This is also called an annual well check.  Dental exams once or twice a year.  Routine eye exams. Ask your health care provider how often you should have your eyes checked.  Personal lifestyle choices, including: ? Daily care of your teeth and gums. ? Regular physical activity. ? Eating a healthy diet. ? Avoiding tobacco and drug use. ? Limiting alcohol use. ? Practicing safe sex. ? Taking vitamin and mineral supplements as recommended by your health care provider. What happens during an annual well check? The services and screenings done by your health care provider during your annual well check will depend on your age, overall health, lifestyle risk factors, and family history of disease. Counseling Your health care provider may ask you questions about your:  Alcohol use.  Tobacco use.  Drug use.  Emotional well-being.  Home and relationship well-being.  Sexual activity.  Eating habits.  Work and work Statistician.  Method of birth control.  Menstrual cycle.  Pregnancy history.  Screening You may have the following tests or measurements:  Height, weight, and BMI.  Diabetes screening. This is done by checking your blood sugar (glucose) after you have not eaten for a while (fasting).  Blood pressure.  Lipid and cholesterol levels. These may be checked every 5 years starting at age 38.  Skin check.  Hepatitis C blood test.  Hepatitis B blood test.  Sexually transmitted disease (STD) testing.  BRCA-related cancer screening. This may be done if you have a family history of breast, ovarian, tubal, or peritoneal cancers.  Pelvic exam and Pap test. This may be done every 3 years starting at age 38. Starting at age 30, this may be done  every 5 years if you have a Pap test in combination with an HPV test.  Discuss your test results, treatment options, and if necessary, the need for more tests with your health care provider. Vaccines Your health care provider may recommend certain vaccines, such as:  Influenza vaccine. This is recommended every year.  Tetanus, diphtheria, and acellular pertussis (Tdap, Td) vaccine. You may need a Td booster every 10 years.  Varicella vaccine. You may need this if you have not been vaccinated.  HPV vaccine. If you are 39 or younger, you may need three doses over 6 months.  Measles, mumps, and rubella (MMR) vaccine. You may need at least one dose of MMR. You may also need a second dose.  Pneumococcal 13-valent conjugate (PCV13) vaccine. You may need this if you have certain conditions and were not previously vaccinated.  Pneumococcal polysaccharide (PPSV23) vaccine. You may need one or two doses if you smoke cigarettes or if you have certain conditions.  Meningococcal vaccine. One dose is recommended if you are age 68-21 years and a first-year college student living in a residence hall, or if you have one of several medical conditions. You may also need additional booster doses.  Hepatitis A vaccine. You may need this if you have certain conditions or if you travel or work in places where you may be exposed to hepatitis A.  Hepatitis B vaccine. You may need this if you have certain conditions or if you travel or work in places where you may be exposed to hepatitis B.  Haemophilus influenzae type b (Hib) vaccine. You may need this  if you have certain risk factors.  Talk to your health care provider about which screenings and vaccines you need and how often you need them. This information is not intended to replace advice given to you by your health care provider. Make sure you discuss any questions you have with your health care provider. Document Released: 04/14/2001 Document Revised:  11/06/2015 Document Reviewed: 12/18/2014 Elsevier Interactive Patient Education  2017 [employer].  Preparing for Pregnancy If you are considering becoming pregnant, make an appointment to see your regular health care provider to learn how to prepare for a safe and healthy pregnancy (preconception care). During a preconception care visit, your health care provider will:  Do a complete physical exam, including a Pap test.  Take a complete medical history.  Give you information, answer your questions, and help you resolve problems.  Preconception checklist Medical history  Tell your health care provider about any current or past medical conditions. Your pregnancy or your ability to become pregnant may be affected by chronic conditions, such as diabetes, chronic hypertension, and thyroid problems.  Include your family's medical history as well as your partner's medical history.  Tell your health care provider about any history of STIs (sexually transmitted infections).These can affect your pregnancy. In some cases, they can be passed to your baby. Discuss any concerns that you have about STIs.  If indicated, discuss the benefits of genetic testing. This testing will show whether there are any genetic conditions that may be passed from you or your partner to your baby.  Tell your health care provider about: ? Any problems you have had with conception or pregnancy. ? Any medicines you take. These include vitamins, herbal supplements, and over-the-counter medicines. ? Your history of immunizations. Discuss any vaccinations that you may need.  Diet  Ask your health care provider what to include in a healthy diet that has a balance of nutrients. This is especially important when you are pregnant or preparing to become pregnant.  Ask your health care provider to help you reach a healthy weight before pregnancy. ? If you are overweight, you may be at higher risk for certain complications,  such as high blood pressure, diabetes, and preterm birth. ? If you are underweight, you are more likely to have a baby who has a low birth weight.  Lifestyle, work, and home  Let your health care provider know: ? About any lifestyle habits that you have, such as alcohol use, drug use, or smoking. ? About recreational activities that may put you at risk during pregnancy, such as downhill skiing and certain exercise programs. ? Tell your health care provider about any international travel, especially any travel to places with an active [place] virus outbreak. ? About harmful substances that you may be exposed to at work or at home. These include chemicals, pesticides, radiation, or even litter boxes. ? If you do not feel safe at home.  Mental health  Tell your health care provider about: ? Any history of mental health conditions, including feelings of depression, sadness, or anxiety. ? Any medicines that you take for a mental health condition. These include herbs and supplements.  Home instructions to prepare for pregnancy Lifestyle  Eat a balanced diet. This includes fresh fruits and vegetables, whole grains, lean meats, low-fat dairy products, healthy fats, and foods that are high in fiber. Ask to meet with a nutritionist or registered dietitian for assistance with meal planning and goals.  Get regular exercise. Try to be active for  at least 30 minutes a day on most days of the week. Ask your health care provider which activities are safe during pregnancy.  Do not use any products that contain nicotine or tobacco, such as cigarettes and e-cigarettes. If you need help quitting, ask your health care provider.  Do not drink alcohol.  Do not take illegal drugs.  Maintain a healthy weight. Ask your health care provider what weight range is right for you.  General instructions  Keep an accurate record of your menstrual periods. This makes it easier for your health care provider to determine  your baby's due date.  Begin taking prenatal vitamins and folic acid supplements daily as directed by your health care provider.  Manage any chronic conditions, such as high blood pressure and diabetes, as told by your health care provider. This is important.  How do I know that I am pregnant? You may be pregnant if you have been sexually active and you miss your period. Symptoms of early pregnancy include:  Mild cramping.  Very light vaginal bleeding (spotting).  Feeling unusually tired.  Nausea and vomiting (morning sickness).  If you have any of these symptoms and you suspect that you might be pregnant, you can take a home pregnancy test. These tests check for a hormone in your urine (human chorionic gonadotropin, or hCG). A woman's body begins to make this hormone during early pregnancy. These tests are very accurate. Wait until at least the first day after you miss your period to take one. If the test shows that you are pregnant (you get a positive result), call your health care provider to make an appointment for prenatal care. What should I do if I become pregnant?  Make an appointment with your health care provider as soon as you suspect you are pregnant.  Do not use any products that contain nicotine, such as cigarettes, chewing tobacco, and e-cigarettes. If you need help quitting, ask your health care provider.  Do not drink alcoholic beverages. Alcohol is related to a number of birth defects.  Avoid toxic odors and chemicals.  You may continue to have sexual intercourse if it does not cause pain or other problems, such as vaginal bleeding. This information is not intended to replace advice given to you by your health care provider. Make sure you discuss any questions you have with your health care provider. Document Released: 01/30/2008 Document Revised: 10/15/2015 Document Reviewed: 09/08/2015 Elsevier Interactive Patient Education  2017 [place].  Diet for  Polycystic Ovarian Syndrome Polycystic ovary syndrome (PCOS) is a disorder of the chemical messengers (hormones) that regulate menstruation. The condition causes important hormones to be out of balance. PCOS can:  Make your periods irregular or stop.  Cause cysts to develop on the ovaries.  Make it difficult to get pregnant.  Stop your body from responding to the effects of insulin (insulin resistance), which can lead to obesity and diabetes.  Changing what you eat can help manage PCOS and improve your health. It can help you lose weight and improve the way your body uses insulin. What is my plan?  Eat breakfast, lunch, and dinner plus two snacks every day.  Include protein in each meal and snack.  Choose whole grains instead of products made with refined flour.  Eat a variety of foods.  Exercise regularly as told by your health care provider. What do I need to know about this eating plan? If you are overweight or obese, pay attention to how many calories you  eat. Cutting down on calories can help you lose weight. Work with your health care provider or dietitian to figure out how many calories you need each day. What foods can I eat? Grains Whole grains, such as whole wheat. Whole-grain breads, crackers, cereals, and pasta. Unsweetened oatmeal, bulgur, barley, quinoa, or brown rice. Corn or whole-wheat flour tortillas. Vegetables  Lettuce. Spinach. Peas. Beets. Cauliflower. Cabbage. Broccoli. Carrots. Tomatoes. Squash. Eggplant. Herbs. Peppers. Onions. Cucumbers. Brussels sprouts. Fruits Berries. Bananas. Apples. Oranges. Grapes. Papaya. Mango. Pomegranate. Kiwi. Grapefruit. Cherries. Meats and Other Protein Sources Lean proteins, such as fish, chicken, beans, eggs, and tofu. Dairy Low-fat dairy products, such as skim milk, cheese sticks, and yogurt. Beverages Low-fat or fat-free drinks, such as water, low-fat milk, sugar-free drinks, and 100% fruit juice. Condiments Ketchup.  Mustard. Barbecue sauce. Relish. Low-fat or fat-free mayonnaise. Fats and Oils Olive oil or canola oil. Walnuts and almonds. The items listed above may not be a complete list of recommended foods or beverages. Contact your dietitian for more options. What foods are not recommended? Foods high in calories or fat. Fried foods. Sweets. Products made from refined white flour, including white bread, pastries, white rice, and pasta. The items listed above may not be a complete list of foods and beverages to avoid. Contact your dietitian for more information. This information is not intended to replace advice given to you by your health care provider. Make sure you discuss any questions you have with your health care provider. Document Released: 06/10/2015 Document Revised: 07/25/2015 Document Reviewed: 02/28/2014 Elsevier Interactive Patient Education  2018 [employer].

## 2016-08-11 LAB — LIPID PANEL
Chol/HDL Ratio: 4.5 ratio — ABNORMAL HIGH (ref 0.0–4.4)
Cholesterol, Total: 158 mg/dL (ref 100–199)
HDL: 35 mg/dL — ABNORMAL LOW (ref >39)
LDL Calculated: 82 mg/dL (ref 0–99)
Triglycerides: 203 mg/dL — ABNORMAL HIGH (ref 0–149)
VLDL CHOLESTEROL CAL: 41 mg/dL — AB (ref 5–40)

## 2016-08-11 LAB — CBC
Hematocrit: 43.4 % (ref 34.0–46.6)
Hemoglobin: 14.4 g/dL (ref 11.1–15.9)
MCH: 24.9 pg — AB (ref 26.6–33.0)
MCHC: 33.2 g/dL (ref 31.5–35.7)
MCV: 75 fL — AB (ref 79–97)
PLATELETS: 290 10*3/uL (ref 150–379)
RBC: 5.78 x10E6/uL — ABNORMAL HIGH (ref 3.77–5.28)
RDW: 16 % — AB (ref 12.3–15.4)
WBC: 10.2 10*3/uL (ref 3.4–10.8)

## 2016-08-11 LAB — DHEA-SULFATE: DHEA-SO4: 66.8 ug/dL (ref 57.3–279.2)

## 2016-08-11 LAB — COMPREHENSIVE METABOLIC PANEL
A/G RATIO: 1.9 (ref 1.2–2.2)
ALT: 19 IU/L (ref 0–32)
AST: 26 IU/L (ref 0–40)
Albumin: 4.8 g/dL (ref 3.5–5.5)
Alkaline Phosphatase: 60 IU/L (ref 39–117)
BILIRUBIN TOTAL: 0.5 mg/dL (ref 0.0–1.2)
BUN/Creatinine Ratio: 14 (ref 9–23)
BUN: 13 mg/dL (ref 6–20)
CALCIUM: 9.9 mg/dL (ref 8.7–10.2)
CHLORIDE: 103 mmol/L (ref 96–106)
CO2: 19 mmol/L — ABNORMAL LOW (ref 20–29)
Creatinine, Ser: 0.92 mg/dL (ref 0.57–1.00)
GFR calc Af Amer: 92 mL/min/{1.73_m2} (ref >59)
GFR calc non Af Amer: 80 mL/min/{1.73_m2} (ref >59)
Globulin, Total: 2.5 g/dL (ref 1.5–4.5)
Glucose: 135 mg/dL — ABNORMAL HIGH (ref 65–99)
POTASSIUM: 4.5 mmol/L (ref 3.5–5.2)
Sodium: 140 mmol/L (ref 134–144)
Total Protein: 7.3 g/dL (ref 6.0–8.5)

## 2016-08-11 LAB — HEMOGLOBIN A1C
ESTIMATED AVERAGE GLUCOSE: 114 mg/dL
HEMOGLOBIN A1C: 5.6 % (ref 4.8–5.6)

## 2016-08-11 LAB — TSH: TSH: 0.23 u[IU]/mL — ABNORMAL LOW (ref 0.450–4.500)

## 2016-08-11 LAB — PROGESTERONE: Progesterone: 11.4 ng/mL

## 2016-08-11 LAB — PROLACTIN: Prolactin: 11 ng/mL (ref 4.8–23.3)

## 2016-08-11 LAB — TESTOSTERONE, FREE, TOTAL, SHBG
SEX HORMONE BINDING: 73.8 nmol/L (ref 24.6–122.0)
TESTOSTERONE FREE: 1 pg/mL (ref 0.0–4.2)
Testosterone: 3 ng/dL — ABNORMAL LOW (ref 8–48)

## 2016-08-11 LAB — INSULIN, RANDOM: INSULIN: 62.1 u[IU]/mL — AB (ref 2.6–24.9)

## 2016-08-11 LAB — FOLLICLE STIMULATING HORMONE: FSH: 4 m[IU]/mL

## 2016-08-12 LAB — HPV, LOW VOLUME (REFLEX): HPV low volume reflex: NEGATIVE

## 2016-08-12 LAB — PAP IG AND HPV HIGH-RISK: PAP SMEAR COMMENT: 0

## 2016-08-13 LAB — NUSWAB VAGINITIS (VG)
Atopobium vaginae: HIGH Score — AB
BVAB 2: HIGH Score — AB
CANDIDA ALBICANS, NAA: NEGATIVE
CANDIDA GLABRATA, NAA: NEGATIVE
Megasphaera 1: HIGH Score — AB
TRICH VAG BY NAA: NEGATIVE

## 2016-08-17 ENCOUNTER — Encounter: Payer: Self-pay | Admitting: Certified Nurse Midwife

## 2016-08-17 ENCOUNTER — Encounter: Payer: Self-pay | Admitting: Physician Assistant

## 2016-08-17 ENCOUNTER — Other Ambulatory Visit: Payer: Self-pay | Admitting: Certified Nurse Midwife

## 2016-08-17 MED ORDER — METRONIDAZOLE 500 MG PO TABS: 500.0000 mg | ORAL_TABLET | Freq: Two times a day (BID) | ORAL | 0 refills | Status: AC

## 2016-08-21 ENCOUNTER — Other Ambulatory Visit: Payer: Self-pay | Admitting: Certified Nurse Midwife

## 2016-08-21 DIAGNOSIS — E079 Disorder of thyroid, unspecified: Secondary | ICD-10-CM

## 2016-09-03 ENCOUNTER — Encounter: Payer: Self-pay | Admitting: Certified Nurse Midwife

## 2016-11-12 ENCOUNTER — Encounter: Payer: Medicaid Other | Admitting: Certified Nurse Midwife

## 2016-11-12 ENCOUNTER — Ambulatory Visit (INDEPENDENT_AMBULATORY_CARE_PROVIDER_SITE_OTHER): Payer: Medicaid Other | Admitting: Obstetrics and Gynecology

## 2016-11-12 ENCOUNTER — Telehealth: Payer: Self-pay

## 2016-11-12 ENCOUNTER — Encounter: Payer: Self-pay | Admitting: Obstetrics and Gynecology

## 2016-11-12 VITALS — BP 122/78 | HR 88 | Ht 67.5 in | Wt 261.9 lb

## 2016-11-12 DIAGNOSIS — Z8742 Personal history of other diseases of the female genital tract: Secondary | ICD-10-CM

## 2016-11-12 DIAGNOSIS — Z98891 History of uterine scar from previous surgery: Secondary | ICD-10-CM

## 2016-11-12 DIAGNOSIS — E8881 Metabolic syndrome: Secondary | ICD-10-CM | POA: Diagnosis not present

## 2016-11-12 DIAGNOSIS — I10 Essential (primary) hypertension: Secondary | ICD-10-CM | POA: Diagnosis not present

## 2016-11-12 DIAGNOSIS — Z8632 Personal history of gestational diabetes: Secondary | ICD-10-CM | POA: Diagnosis not present

## 2016-11-12 DIAGNOSIS — R000 Tachycardia, unspecified: Secondary | ICD-10-CM | POA: Diagnosis not present

## 2016-11-12 DIAGNOSIS — G90A Postural orthostatic tachycardia syndrome (POTS): Secondary | ICD-10-CM

## 2016-11-12 DIAGNOSIS — E02 Subclinical iodine-deficiency hypothyroidism: Secondary | ICD-10-CM

## 2016-11-12 DIAGNOSIS — I951 Orthostatic hypotension: Secondary | ICD-10-CM

## 2016-11-12 NOTE — Patient Instructions (Addendum)
1. Recommend timed intercourse 2. Continue prenatal vitamins 3. Return early in pregnancy to confirm dating and fetal viability 4. Continue with antihypertensive medications as prescribed 5. Continue with reduced dose of Synthroid as written for hypothyroidism 6. Continue with healthy eating and exercise, and controlled weight loss 7.. Avoid alcohol, tobacco, drugs

## 2016-11-12 NOTE — Telephone Encounter (Signed)
-----   Message from Gunnar BullaJenkins Michelle Lawhorn, CNM sent at 11/11/2016  9:37 AM EDT ----- Please contact patient about rescheduling visit with MD.   Thanks, JML

## 2016-11-12 NOTE — Telephone Encounter (Signed)
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

## 2016-11-14 DIAGNOSIS — E8881 Metabolic syndrome: Secondary | ICD-10-CM | POA: Insufficient documentation

## 2016-11-14 NOTE — Progress Notes (Signed)
GYN ENCOUNTER NOTE  Subjective:       Stacie Keller is a 36 y.o. G29P0201 female is here for gynecologic evaluation of the following issues:  1. Preconception counseling.  Obstetric history: Para 0201 G1-classical C-section at 27 weeks for chronic hypertension with superimposed severe preeclampsia; subsequent fetal demise 3 days after birth. 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 G2-suspected cholestasis of pregnancy (patient history) at [redacted] weeks gestation with repeat cesarean section being performed; patient states symptomatology was related to a new diagnosis of POTS syndrome, and not cholestasis  Preconception risk factors:  Chronic hypertension; first pregnancy complicated by superimposed severe preeclampsia  POTS syndrome  Morbid obesity  Metabolic syndrome/insulin resistance  Hypothyroidism  Asthma  Aniridia,PAX 6 mutation, autosomal dominant (prenatal diagnosis is possible)  History of classical C-section  History of repeat cesarean section  Menstrual history: Intervals-daily 28 days Duration of flow-5 days No intermenstrual bleeding.  Lab review: 08/10/2016 Nuswab-positive for back to vaginosis, treated 08/10/2016 Pap smear-negative 08/10/2016 TSH 0.23 decreased; Synthroid has been adjusted downwards to 175 g daily 08/10/2016 CBC normal except for low MCV and [facility] 08/10/2016 CMP-glucose 135, otherwise normal 08/10/2016 lipid panel-triglycerides 203, high; HDL 35, low 08/10/2016 hemoglobin A1c 5.6 08/10/2016 insulin, random 62.1, elevated 08/10/2016 prolactin 11.0 normal   Obstetric History OB History  Gravida Para Term Preterm AB Living  SAB TAB Ectopic Multiple Live Births           2    # Outcome Date GA Lbr Len/2nd Weight Sex Delivery Anes PTL Lv  2 Preterm 09/2013 [redacted]w[redacted]d  6 lb 10 oz (3.005 kg) F CS-Unspec   LIV  1 Preterm  [redacted]w[redacted]d  1 lb 12 oz (0.794 kg) M CS-Classical   DEC      Past Medical History:  Diagnosis Date  . Anxiety   . Asthma   . Depression   . Diabetes mellitus without complication (HCC)   . Migraines   . PCOS (polycystic ovarian syndrome)   . Pott's disease   . Thyroid disease     Past Surgical History:  Procedure Laterality Date  . APPENDECTOMY    . CESAREAN SECTION     x2  . CHOLECYSTECTOMY N/A 02/05/2015   Procedure: LAPAROSCOPIC CHOLECYSTECTOMY WITH INTRAOPERATIVE CHOLANGIOGRAM;  Surgeon: Lattie Haw, MD;  Location: [facility] ORS;  Service: General;  Laterality: N/A;  . EYE SURGERY      Current Outpatient Prescriptions on File Prior to Visit  Medication Sig Dispense Refill  . Docosahexaenoic Acid (PRENATAL DHA) 200 MG CAPS Take by mouth.    . Ipratropium-Albuterol (COMBIVENT RESPIMAT) 20-100 MCG/ACT AERS respimat Inhale 1 puff into the lungs every 6 (six) hours as needed for wheezing or shortness of breath.     Marland Kitchen LORazepam (ATIVAN) 0.5 MG tablet Take 0.5 mg by mouth every 6 (six) hours as needed for anxiety.    . metFORMIN (GLUCOPHAGE) 500 MG tablet TK 1 T PO BID  2  . metoprolol succinate (TOPROL-XL) 50 MG 24 hr tablet Take 1 & 1/2 tablets (75 mg) by mouth once daily 135 tablet 3  . ORAL ELECTROLYTES PO Take 270 mg by mouth. 1.5 at night and morning    . Vitamin D, Ergocalciferol, (  DRISDOL) 50000 units CAPS capsule TK 1 C PO WEEKLY  3   No current facility-administered medications on file prior to visit.     Allergies  Allergen Reactions  . Hydrocodone Nausea And Vomiting  . Latex Hives, Itching and Swelling  . Oxycodone Nausea And Vomiting    Social History   Social History  . Marital status: Married    Spouse name: N/A  . Number of children: N/A  . Years of education: N/A   Occupational History  . Not on  file.   Social History Main Topics  . Smoking status: Never Smoker  . Smokeless tobacco: Never Used  . Alcohol use No  . Drug use: No  . Sexual activity: Yes    Birth control/ protection: None     Comment: Trying to conceive    Other Topics Concern  . Not on file   Social History Narrative  . No narrative on file    Family History  Problem Relation Age of Onset  . Asthma Mother   . COPD Mother   . Heart disease Mother   . Hypertension Mother   . Diabetes Father   . Hypertension Father   . Breast cancer Paternal Grandmother 56  . Ovarian cancer Neg Hx   . Colon cancer Neg Hx     The following portions of the patient's history were reviewed and updated as appropriate: allergies, current medications, past family history, past medical history, past social history, past surgical history and problem list.  Objective:   BP 122/78   Pulse 88   Ht 5' 7.5" (1.715 m)   Wt 261 lb 14.4 oz (118.8 kg)   LMP 11/03/2016 (Exact Date)   BMI 40.41 kg/m  Physical exam-deferred    Assessment:   1. History of PCOS  2. Morbid obesity (HCC)  3. History of classical cesarean section  4. Chronic hypertension  5. History of gestational diabetes  6. Subclinical iodine-deficiency hypothyroidism  7. POTS (postural orthostatic tachycardia syndrome)     Plan:   1. Prenatal vitamins 2. Timed intercourse 3. Maintain good control of chronic health conditions including blood sugar, thyroid function, blood pressure. 4. Continue with healthy eating, exercise, controlled weight loss to achieve a more normal BMI. 5. Counseling regarding risk factors for pregnancy were reviewed. Patient is acutely aware of her clinical diagnoses which makes her a high risk candidate for pregnancy in the future. She does understand that history of classical cesarean section will require repeat cesarean section delivery at 36-37 weeks. She does understand that her chronic hypertension, metabolic  syndrome/insulin resistance, and morbid obesity will require very close surveillance during pregnancy including growth scans, antepartum fetal testing, etc. She does understand that she is at increased risk for superimposed preeclampsia because of her age, history of chronic hypertension, history of prior superimposed preeclampsia with first pregnancy. The patient is aware of the need for maternal fetal medicine surveillance during any future pregnancy. It is possible that her pregnancy can be co- managed here at this office with input from [facility] perinatology. The patient understands that she appears to be having regular ovulatory cycles with menstrual intervals occurring every 28 days. If she has no success in conception within the next 9 months, she may consider reproductive endocrinology referral if she wishes to aggressively pursue future pregnancy.  A total of 25 minutes were spent face-to-face with the patient during this encounter and over half of that time involved counseling and coordination of care.  Prentice Docker Defrancesco,  MD  Note: This dictation was prepared with Dragon dictation along with smaller phrase technology. Any transcriptional errors that result from this process are unintentional.

## 2017-03-25 ENCOUNTER — Other Ambulatory Visit
Admission: RE | Admit: 2017-03-25 | Discharge: 2017-03-25 | Disposition: A | Discharge status: Home / Self Care | Payer: Medicaid Other | Source: Ambulatory Visit | Attending: Internal Medicine | Admitting: Internal Medicine

## 2017-03-25 DIAGNOSIS — Z9049 Acquired absence of other specified parts of digestive tract: Secondary | ICD-10-CM | POA: Insufficient documentation

## 2017-03-25 DIAGNOSIS — K529 Noninfective gastroenteritis and colitis, unspecified: Secondary | ICD-10-CM | POA: Insufficient documentation

## 2017-03-25 LAB — GASTROINTESTINAL PANEL BY PCR, STOOL (REPLACES STOOL CULTURE)

## 2017-03-25 LAB — C DIFFICILE QUICK SCREEN W PCR REFLEX
C DIFFICILE (CDIFF) INTERP: NOT DETECTED
C DIFFICILE (CDIFF) TOXIN: NEGATIVE
C DIFFICLE (CDIFF) ANTIGEN: NEGATIVE

## 2017-05-21 ENCOUNTER — Other Ambulatory Visit: Payer: Self-pay | Admitting: Family

## 2017-05-21 ENCOUNTER — Ambulatory Visit
Admission: RE | Admit: 2017-05-21 | Discharge: 2017-05-21 | Disposition: A | Discharge status: Home / Self Care | Payer: Medicaid Other | Source: Ambulatory Visit | Attending: Family | Admitting: Family

## 2017-05-21 DIAGNOSIS — R0600 Dyspnea, unspecified: Secondary | ICD-10-CM

## 2017-08-12 ENCOUNTER — Encounter: Payer: Medicaid Other | Admitting: Certified Nurse Midwife

## 2017-08-18 ENCOUNTER — Other Ambulatory Visit: Payer: Self-pay | Admitting: Internal Medicine

## 2017-09-29 ENCOUNTER — Encounter: Payer: Self-pay | Admitting: Internal Medicine

## 2017-10-01 NOTE — Telephone Encounter (Signed)
Called pt today in response to her MyChart message. She states her CP is recurrent and feels tight. She states this is usually accompanied with GI Bloating and cramping. She states she has not taken any medications for heart burn during these episodes. 2017 myoview was normal. I advised pt if her chest pain got worse and/or was accompanied with SOB, back/neck pain, sweating, etc she should seek help from an ED.   Pt is overdue a follow up with Dr Graciela HusbandsKlein. I will send a message to scheduling to have her an appointment set up within the next few weeks.   Pt agrees to plan and verbalized understanding.

## 2017-12-09 ENCOUNTER — Ambulatory Visit: Payer: Medicaid Other | Admitting: Internal Medicine

## 2017-12-09 ENCOUNTER — Encounter: Payer: Self-pay | Admitting: Internal Medicine

## 2017-12-09 VITALS — BP 120/85 | HR 85 | Ht 67.5 in | Wt 258.8 lb

## 2017-12-09 DIAGNOSIS — I10 Essential (primary) hypertension: Secondary | ICD-10-CM | POA: Diagnosis not present

## 2017-12-09 DIAGNOSIS — R079 Chest pain, unspecified: Secondary | ICD-10-CM

## 2017-12-09 DIAGNOSIS — E1169 Type 2 diabetes mellitus with other specified complication: Secondary | ICD-10-CM | POA: Diagnosis not present

## 2017-12-09 DIAGNOSIS — E669 Obesity, unspecified: Secondary | ICD-10-CM

## 2017-12-09 DIAGNOSIS — R000 Tachycardia, unspecified: Secondary | ICD-10-CM | POA: Diagnosis not present

## 2017-12-09 DIAGNOSIS — E1159 Type 2 diabetes mellitus with other circulatory complications: Secondary | ICD-10-CM

## 2017-12-09 NOTE — Patient Instructions (Signed)
Medication Instructions:  Your physician recommends that you continue on your current medications as directed. Please refer to the Current Medication list given to you today. If you need a refill on your cardiac medications before your next appointment, please call your pharmacy.   Lab work: None ordered. If you have labs (blood work) drawn today and your tests are completely normal, you will receive your results only by: Marland Kitchen MyChart Message (if you have MyChart) OR . A paper copy in the mail If you have any lab test that is abnormal or we need to change your treatment, we will call you to review the results.  Testing/Procedures: Follow up with your PCP regarding a sleep study.  Follow-Up: At [facility], you and your health needs are our priority.  As part of our continuing mission to provide you with exceptional heart care, we have created designated Provider Care Teams.  These Care Teams include your primary Cardiologist (physician) and Advanced Practice Providers (APPs -  Physician Assistants and Nurse Practitioners) who all work together to provide you with the care you need, when you need it. You will need a follow up appointment in 12 months.  Please call our office 2 months in advance to schedule this appointment.  You may see one of the following Advanced Practice Providers on your designated Care Team:   Gypsy Balsam, NP . Francis Dowse, PA-C  Any Other Special Instructions Will Be Listed Below (If Applicable).

## 2017-12-09 NOTE — Progress Notes (Signed)
Patient Care Team: System, Pcp Not In as PCP - General (General Practice)   HPI  Stacie Keller is a 37 y.o. 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  She is some degree of resting tachycardia and inappropriate response to activity.   She has significant sleep disturbance.    She has complex mental illness; struggled with significant depression related to the loss of her premature son and her mother back in 2014.   She has had complaints of chest pain.  They are less persistent now than they were.  A couple of years ago, they were 24 7.  They are not exertional.  She describes it as heaviness.  She has chronic dyspnea on exertion.  Chronic fatigue.  She had a sleep study years ago the results of which I could not find mentioned in the old [facility] records but apparently there was arousal many many times.    Past Medical History:  Diagnosis Date  . Anxiety   . Asthma   . Depression   . Diabetes mellitus without complication (HCC)   . Migraines   . PCOS (polycystic ovarian syndrome)   . Pott's disease   . Thyroid disease     Past Surgical History:  Procedure Laterality Date  . APPENDECTOMY    . CESAREAN SECTION     x2  . CHOLECYSTECTOMY N/A 02/05/2015   Procedure: LAPAROSCOPIC CHOLECYSTECTOMY WITH INTRAOPERATIVE CHOLANGIOGRAM;  Surgeon: Lattie Haw, MD;  Location: [facility] ORS;  Service: General;  Laterality: N/A;  . EYE SURGERY      Current Outpatient Medications  Medication Sig Dispense Refill  . Docosahexaenoic Acid (PRENATAL DHA) 200 MG CAPS Take by mouth daily.     . Ipratropium-Albuterol (COMBIVENT RESPIMAT) 20-100 MCG/ACT AERS respimat Inhale 1 puff into the lungs every 6 (six) hours as needed for wheezing or shortness of breath.     . levothyroxine (SYNTHROID, LEVOTHROID) 150 MCG tablet Take 150 mcg by mouth daily before breakfast.    .  metoprolol succinate (TOPROL-XL) 50 MG 24 hr tablet Take 100 mg by mouth daily. Take with or immediately following a meal.    . Vitamin D, Ergocalciferol, (DRISDOL) 50000 units CAPS capsule Take 50,000 Units by mouth every 7 (seven) days.   3   No current facility-administered medications for this visit.     Allergies  Allergen Reactions  . Hydrocodone Nausea And Vomiting  . Latex Hives, Itching and Swelling  . Oxycodone Nausea And Vomiting      Review of Systems negative except from HPI and PMH  Physical Exam BP 120/85   Pulse 85   Ht 5' 7.5" (1.715 m)   Wt 258 lb 12.8 oz (117.4 kg)   SpO2 98%   BMI 39.94 kg/m  Well developed and nourished in no acute distress HENT normal Neck supple with JVP-flat Clear Regular rate and rhythm, no murmurs or gallops Abd-soft with active BS No Clubbing cyanosis edema Skin-warm and dry A & Oriented  Grossly normal sensory and motor function    ECG  Sinus 85 14[birth year]   Assessment and  Plan  PCOS  Tachycardia  Sinus  Obesity  depression  PTSD  Chest pain   Chest pain is almost certainly noncardiac given his prolonged nature.  I offered her calcium scoring; she would like to postpone that at the present time.  Continue her metoprolol which is done a significant  job in improving functional status.  Have asked her to discuss with her PCP regarding repeat sleep study  We spent more than 50% of our >25 min visit in face to face counseling regarding the above         Current medicines are reviewed at length with the patient today .  The patient does not  have concerns regarding medicines.

## 2018-05-01 ENCOUNTER — Emergency Department
Admission: EM | Admit: 2018-05-01 | Discharge: 2018-05-01 | Disposition: A | Discharge status: Home / Self Care | Payer: Medicaid Other | Attending: Emergency Medicine | Admitting: Emergency Medicine

## 2018-05-01 ENCOUNTER — Emergency Department: Payer: Medicaid Other

## 2018-05-01 ENCOUNTER — Other Ambulatory Visit: Payer: Self-pay

## 2018-05-01 DIAGNOSIS — Z79899 Other long term (current) drug therapy: Secondary | ICD-10-CM | POA: Diagnosis not present

## 2018-05-01 DIAGNOSIS — E039 Hypothyroidism, unspecified: Secondary | ICD-10-CM | POA: Diagnosis not present

## 2018-05-01 DIAGNOSIS — J45909 Unspecified asthma, uncomplicated: Secondary | ICD-10-CM | POA: Diagnosis not present

## 2018-05-01 DIAGNOSIS — G43001 Migraine without aura, not intractable, with status migrainosus: Secondary | ICD-10-CM | POA: Insufficient documentation

## 2018-05-01 DIAGNOSIS — E119 Type 2 diabetes mellitus without complications: Secondary | ICD-10-CM | POA: Insufficient documentation

## 2018-05-01 DIAGNOSIS — R51 Headache: Secondary | ICD-10-CM | POA: Diagnosis present

## 2018-05-01 DIAGNOSIS — J0130 Acute sphenoidal sinusitis, unspecified: Secondary | ICD-10-CM | POA: Diagnosis not present

## 2018-05-01 DIAGNOSIS — Z91040 Latex allergy status: Secondary | ICD-10-CM | POA: Diagnosis not present

## 2018-05-01 MED ORDER — CEFDINIR 300 MG PO CAPS: 300.0000 mg | ORAL_CAPSULE | Freq: Two times a day (BID) | ORAL | 0 refills | Status: DC

## 2018-05-01 MED ORDER — ONDANSETRON HCL 4 MG/2ML IJ SOLN
4.0000 mg | Freq: Once | INTRAMUSCULAR | Status: AC
  Administered 2018-05-01: 4 mg via INTRAVENOUS
  Filled 2018-05-01: qty 2

## 2018-05-01 MED ORDER — FENTANYL CITRATE (PF) 100 MCG/2ML IJ SOLN
50.0000 ug | Freq: Once | INTRAMUSCULAR | Status: AC
  Administered 2018-05-01: 50 ug via INTRAVENOUS
  Filled 2018-05-01: qty 2

## 2018-05-01 MED ORDER — FLUCONAZOLE 150 MG PO TABS: ORAL_TABLET | ORAL | 0 refills | Status: DC

## 2018-05-01 MED ORDER — SODIUM CHLORIDE 0.9 % IV BOLUS
1000.0000 mL | Freq: Once | INTRAVENOUS | Status: AC
  Administered 2018-05-01: 1000 mL via INTRAVENOUS

## 2018-05-01 MED ORDER — SUMATRIPTAN SUCCINATE 100 MG PO TABS: ORAL_TABLET | ORAL | 0 refills | Status: DC

## 2018-05-01 NOTE — ED Provider Notes (Signed)
[facility] Emergency Department Provider Note  ____________________________________________   First MD Initiated Contact with Patient 05/01/18 1349     (approximate)  I have reviewed the triage vital signs and the nursing notes.   HISTORY  Chief Complaint Migraine    HPI Stacie Keller is a 38 y.o. female presents emergency department complaining of a migraine since last night.  Positive for nausea.  No vomiting.  She states ibuprofen and Tylenol did not usually work for her headaches.  She states that this headache is worse and different than her normal headaches.  She states that it involves her whole head.  She denies any fever or chills.    Past Medical History:  Diagnosis Date  . Anxiety   . Asthma   . Depression   . Diabetes mellitus without complication (HCC)   . Migraines   . PCOS (polycystic ovarian syndrome)   . Pott's disease   . Thyroid disease     Patient Active Problem List   Diagnosis Date Noted  . Metabolic syndrome 11/14/2016  . Syncope 02/03/2016  . Family history of early CAD 02/03/2016  . Symptomatic cholelithiasis 02/04/2015  . Biliary colic   . Acid reflux 09/18/2013  . 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 Conjunctival vascular abnormality 08/01/2013  . Airway hyperreactivity 03/29/2013  . Better eye: total vision impairment, lesser eye: total vision impairment 03/29/2013  . Clinical depression 03/29/2013  . Major depressive disorder with single episode 03/29/2013  . Type 2 diabetes mellitus (HCC) 03/29/2013  . H/O disease 08/23/2012  . 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 Common migraine with intractable migraine 10/09/2010  . Benign hypertension 09/06/2010  . HLD (hyperlipidemia) 09/06/2010  . Adult hypothyroidism 09/06/2010  . Obesity, diabetes, and hypertension syndrome (HCC) 09/06/2010  . Bilateral polycystic ovarian syndrome 09/06/2010    Past Surgical History:  Procedure  Laterality Date  . APPENDECTOMY    . CESAREAN SECTION     x2  . CHOLECYSTECTOMY N/A 02/05/2015   Procedure: LAPAROSCOPIC CHOLECYSTECTOMY WITH INTRAOPERATIVE CHOLANGIOGRAM;  Surgeon: Lattie Haw, MD;  Location: [facility] ORS;  Service: General;  Laterality: N/A;  . EYE SURGERY      Prior to Admission medications   Medication Sig Start Date End Date Taking? Authorizing Provider  cefdinir (OMNICEF) 300 MG capsule Take 1 capsule (300 mg total) by mouth 2 (two) times daily. 05/01/18   Giannis Corpuz, Roselyn Bering, PA-C  Docosahexaenoic Acid (PRENATAL DHA) 200 MG CAPS Take by mouth daily.     [provider]  fluconazole (DIFLUCAN) 150 MG tablet Take one now and one in a week 05/01/18   Sherrie Mustache, Roselyn Bering, PA-C  Ipratropium-Albuterol (COMBIVENT RESPIMAT) 20-100 MCG/ACT AERS respimat Inhale 1 puff into the lungs every 6 (six) hours as needed for wheezing or shortness of breath.     [provider]  levothyroxine (SYNTHROID, LEVOTHROID) 150 MCG tablet Take 150 mcg by mouth daily before breakfast.    [provider]  metoprolol succinate (TOPROL-XL) 50 MG 24 hr tablet Take 100 mg by mouth daily. Take with or immediately following a meal.    [provider]  SUMAtriptan (IMITREX) 100 MG tablet Take 1 pill at onset of headache.  May repeat x1 in 2 hours if not improving. 05/01/18   Sherrie Mustache Roselyn Bering, PA-C  Vitamin D, Ergocalciferol, (DRISDOL) 50000 units CAPS capsule Take 50,000 Units by mouth every 7 (seven) days.  07/17/16   [provider]  Allergies Hydrocodone; Latex; and Oxycodone  Family History  Problem Relation Age of Onset  . Asthma Mother   . COPD Mother   . Heart disease Mother   . Hypertension Mother   . Diabetes Father   . Hypertension Father   . Breast cancer Paternal Grandmother 40  . Ovarian cancer Neg Hx   . Colon cancer Neg Hx     Social History Social History   Tobacco Use  . Smoking status: Never Smoker  . Smokeless tobacco: Never Used    Substance Use Topics  . Alcohol use: No  . Drug use: No    Review of Systems  Constitutional: No fever/chills, positive for new onset migraine headache Eyes: No visual changes. ENT: No sore throat. Respiratory: Denies cough Genitourinary: Negative for dysuria. Musculoskeletal: Negative for back pain. Skin: Negative for rash.    ____________________________________________   PHYSICAL EXAM:  VITAL SIGNS: ED Triage Vitals  Enc Vitals Group     BP 05/01/18 1227 119/84     Pulse Rate 05/01/18 1227 78     Resp 05/01/18 1227 18     Temp 05/01/18 1227 98.2 F (36.8 C)     Temp Source 05/01/18 1227 Oral     SpO2 05/01/18 1227 99 %     Weight 05/01/18 1228 260 lb (117.9 kg)     Height 05/01/18 1228 5\' 7"  (1.702 m)     Head Circumference --      Peak Flow --      Pain Score 05/01/18 1228 10     Pain Loc --      Pain Edu? --      Excl. in GC? --     Constitutional: Alert and oriented. Well appearing and in no acute distress. Eyes: Conjunctivae are normal.  Head: Atraumatic. Nose: No congestion/rhinnorhea. Mouth/Throat: Mucous membranes are moist.   Neck:  supple no lymphadenopathy noted Cardiovascular: Normal rate, regular rhythm. Heart sounds are normal Respiratory: Normal respiratory effort.  No retractions, lungs c t a  GU: deferred Musculoskeletal: FROM all extremities, warm and well perfused Neurologic:  Normal speech and language.  Cranial nerves II through XII grossly intact Skin:  Skin is warm, dry and intact. No rash noted. Psychiatric: Mood and affect are normal. Speech and behavior are normal.  ____________________________________________   LABS (all labs ordered are listed, but only abnormal results are displayed)  Labs Reviewed - No data to display ____________________________________________   ____________________________________________  RADIOLOGY  CT of the head without contrast due to new onset  migraine  ____________________________________________   PROCEDURES  Procedure(s) performed: Saline lock, normal saline 1 L IV, Zofran 4 mg IV, fentanyl 50 mcg IV  Procedures    ____________________________________________   INITIAL IMPRESSION / ASSESSMENT AND PLAN / ED COURSE  Pertinent labs & imaging results that were available during my care of the patient were reviewed by me and considered in my medical decision making (see chart for details).   Patient is 38 year old female presents emergency department stating she has a history of migraines but this type of migraine is very different.  It involves her whole head and feels like the worst headache she is ever had.  Physical exam shows an afebrile patient.  Cranial nerves II through XII grossly intact.  Remainder exam is unremarkable.   CT of the head without contrast  Saline lock, normal saline 1 L IV, Zofran 4 mg IV, fentanyl IV   ----------------------------------------- 3:41 PM on 05/01/2018 -----------------------------------------  CT of the  head is negative for any acute abnormality.  However sphenoid sinusitis is noted.  Explained all findings to the patient.  She was placed on Omnicef 300 twice daily, Diflucan, and given a prescription for Imitrex as she is asking if there is something she could take and still come to the ED.  She is to follow-up with her regular doctor if not better in 2 to 3 days.  Return if worsening.  States she understands will comply she is discharged stable condition.  As part of my medical decision making, I reviewed the following data within the electronic MEDICAL RECORD NUMBER Nursing notes reviewed and incorporated, Old chart reviewed, Radiograph reviewed CT of the head is negative, Notes from prior ED visits and [place] Controlled Substance Database  ____________________________________________   FINAL CLINICAL IMPRESSION(S) / ED DIAGNOSES  Final diagnoses:  Migraine without aura and with  status migrainosus, not intractable  Acute sphenoidal sinusitis, recurrence not specified      NEW MEDICATIONS STARTED DURING THIS VISIT:  New Prescriptions   CEFDINIR (OMNICEF) 300 MG CAPSULE    Take 1 capsule (300 mg total) by mouth 2 (two) times daily.   FLUCONAZOLE (DIFLUCAN) 150 MG TABLET    Take one now and one in a week   SUMATRIPTAN (IMITREX) 100 MG TABLET    Take 1 pill at onset of headache.  May repeat x1 in 2 hours if not improving.     Note:  This document was prepared using Dragon voice recognition software and may include unintentional dictation errors.    Faythe Ghee, PA-C 05/01/18 1542    Jene Every, MD 05/01/18 [id number]

## 2018-05-01 NOTE — ED Triage Notes (Signed)
Pt c/o migraine since last night with nausea, states she has a hx of the same . States she has taken zofran for nausea today but has not taken anything for the pain, not even IBU.

## 2018-05-01 NOTE — ED Notes (Signed)
AAOx3.  Skin ware and dry.  NAD

## 2018-05-01 NOTE — Discharge Instructions (Addendum)
Follow-up with your regular doctor if not better in 3 days.  Return emergency department worsening.  Take the medications as prescribed.  Please try to stay hydrated as this helps to prevent some migraines.

## 2018-05-01 NOTE — ED Notes (Signed)
Pt returned from CT at this time.  

## 2019-10-15 ENCOUNTER — Encounter: Payer: Self-pay | Admitting: Emergency Medicine

## 2019-10-15 ENCOUNTER — Other Ambulatory Visit: Payer: Self-pay

## 2019-10-15 ENCOUNTER — Emergency Department
Admission: EM | Admit: 2019-10-15 | Discharge: 2019-10-15 | Disposition: A | Discharge status: Home / Self Care | Payer: Medicaid Other | Attending: Emergency Medicine | Admitting: Emergency Medicine

## 2019-10-15 DIAGNOSIS — Z5321 Procedure and treatment not carried out due to patient leaving prior to being seen by health care provider: Secondary | ICD-10-CM | POA: Insufficient documentation

## 2019-10-15 DIAGNOSIS — R05 Cough: Secondary | ICD-10-CM | POA: Diagnosis not present

## 2019-10-15 DIAGNOSIS — R0981 Nasal congestion: Secondary | ICD-10-CM | POA: Diagnosis not present

## 2019-10-15 DIAGNOSIS — R519 Headache, unspecified: Secondary | ICD-10-CM | POA: Insufficient documentation

## 2019-10-15 NOTE — ED Notes (Signed)
Called in main waiting room, no answer

## 2019-10-15 NOTE — ED Triage Notes (Signed)
Pt arrived via POV with c/o sinus infection, pt c/o headache and congestion states she also has migraine which she thinks is related to the congestion making the pain worse.  Pt taking ibuprofen and tylenol with no relief.  Pt has not taken any migraine meds for relief.

## 2019-10-15 NOTE — ED Notes (Addendum)
Called in main waiting room no answer.

## 2019-10-15 NOTE — ED Notes (Signed)
Pt called x 3 , no answer

## 2020-01-17 ENCOUNTER — Ambulatory Visit
Admission: EM | Admit: 2020-01-17 | Discharge: 2020-01-17 | Disposition: A | Discharge status: Home / Self Care | Payer: Medicaid Other | Attending: Family Medicine | Admitting: Family Medicine

## 2020-01-17 ENCOUNTER — Encounter: Payer: Self-pay | Admitting: Emergency Medicine

## 2020-01-17 ENCOUNTER — Other Ambulatory Visit: Payer: Self-pay

## 2020-01-17 DIAGNOSIS — B373 Candidiasis of vulva and vagina: Secondary | ICD-10-CM

## 2020-01-17 DIAGNOSIS — B3731 Acute candidiasis of vulva and vagina: Secondary | ICD-10-CM

## 2020-01-17 DIAGNOSIS — B9689 Other specified bacterial agents as the cause of diseases classified elsewhere: Secondary | ICD-10-CM | POA: Diagnosis not present

## 2020-01-17 DIAGNOSIS — N760 Acute vaginitis: Secondary | ICD-10-CM | POA: Diagnosis not present

## 2020-01-17 LAB — WET PREP, GENITAL
Sperm: NONE SEEN
Trich, Wet Prep: NONE SEEN

## 2020-01-17 MED ORDER — FLUCONAZOLE 150 MG PO TABS: 150.0000 mg | ORAL_TABLET | Freq: Once | ORAL | 0 refills | Status: AC

## 2020-01-17 MED ORDER — METRONIDAZOLE 500 MG PO TABS: 500.0000 mg | ORAL_TABLET | Freq: Two times a day (BID) | ORAL | 0 refills | Status: DC

## 2020-01-17 NOTE — ED Provider Notes (Signed)
[facility]    CSN: [id number] Arrival date & time: 01/17/20  1150      History   Chief Complaint Chief Complaint  Patient presents with  . Vaginal Itching   HPI  39 year old female presents with the above complaint.  Patient reports that her symptoms started yesterday.  She reports vaginal discharge, itching, and some burning.  Denies recent antibiotic use.  She is diabetic and her sugars are uncontrolled.  Patient states that she believes he has a yeast infection.  Medications interventions tried.  No other complaints.  Past Medical History:  Diagnosis Date  . Anxiety   . Asthma   . Depression   . Diabetes mellitus without complication (HCC)   . Migraines   . PCOS (polycystic ovarian syndrome)   . Pott's disease   . Thyroid disease     Patient Active Problem List   Diagnosis Date Noted  . Metabolic syndrome 11/14/2016  . Syncope 02/03/2016  . Family history of early CAD 02/03/2016  . Symptomatic cholelithiasis 02/04/2015  . Biliary colic   . Acid reflux 09/18/2013  . 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 Conjunctival vascular abnormality 08/01/2013  . Airway hyperreactivity 03/29/2013  . Better eye: total vision impairment, lesser eye: total vision impairment 03/29/2013  . Clinical depression 03/29/2013  . Major depressive disorder with single episode 03/29/2013  . Type 2 diabetes mellitus (HCC) 03/29/2013  . H/O disease 08/23/2012  . 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 Common migraine with intractable migraine 10/09/2010  . Benign hypertension 09/06/2010  . HLD (hyperlipidemia) 09/06/2010  . Adult hypothyroidism 09/06/2010  . Obesity, diabetes, and hypertension syndrome (HCC) 09/06/2010  . Bilateral polycystic ovarian syndrome 09/06/2010    Past Surgical History:  Procedure Laterality Date  . APPENDECTOMY    . CESAREAN SECTION     x2  . CHOLECYSTECTOMY N/A 02/05/2015   Procedure: LAPAROSCOPIC CHOLECYSTECTOMY  WITH INTRAOPERATIVE CHOLANGIOGRAM;  Surgeon: Lattie Haw, MD;  Location: [facility] ORS;  Service: General;  Laterality: N/A;  . EYE SURGERY      OB History    Gravida  2   Para  2   Term      Preterm  2   AB      Living  1     SAB      TAB      Ectopic      Multiple      Live Births  2            Home Medications    Prior to Admission medications   Medication Sig Start Date End Date Taking? Authorizing Provider  colestipol (COLESTID) 1 g tablet colestipol 1 gram tablet 05/03/19  Yes [provider]  Ipratropium-Albuterol (COMBIVENT RESPIMAT) 20-100 MCG/ACT AERS respimat Inhale 1 puff into the lungs every 6 (six) hours as needed for wheezing or shortness of breath.    Yes [provider]  levothyroxine (SYNTHROID, LEVOTHROID) 150 MCG tablet Take 150 mcg by mouth daily before breakfast.   Yes [provider]  metoprolol succinate (TOPROL-XL) 50 MG 24 hr tablet Take 100 mg by mouth daily. Take with or immediately following a meal.   Yes [provider]  sitaGLIPtin (JANUVIA) 25 MG tablet Januvia 25 mg tablet  TK 1 T PO D. 02/17/18  Yes [provider]  Docosahexaenoic Acid (PRENATAL DHA) 200 MG CAPS Take by mouth daily.     [provider]  fluconazole (DIFLUCAN)  150 MG tablet Take 1 tablet (150 mg total) by mouth once for 1 dose. Repeat dose in 72 hours. 01/17/20 01/17/20  Tommie Sams, DO  metroNIDAZOLE (FLAGYL) 500 MG tablet Take 1 tablet (500 mg total) by mouth 2 (two) times daily. 01/17/20   Tommie Sams, DO  SUMAtriptan (IMITREX) 100 MG tablet Take 1 pill at onset of headache.  May repeat x1 in 2 hours if not improving. 05/01/18 01/17/20  Faythe Ghee, PA-C    Family History Family History  Problem Relation Age of Onset  . Asthma Mother   . COPD Mother   . Heart disease Mother   . Hypertension Mother   . Diabetes Father   . Hypertension Father   . Breast cancer Paternal Grandmother 62  . Ovarian  cancer Neg Hx   . Colon cancer Neg Hx     Social History Social History   Tobacco Use  . Smoking status: Never Smoker  . Smokeless tobacco: Never Used  Vaping Use  . Vaping Use: Never used  Substance Use Topics  . Alcohol use: No  . Drug use: No     Allergies   Hydrocodone, Latex, and Oxycodone   Review of Systems Review of Systems  Genitourinary:       Vaginal itching and discharge.   Physical Exam Triage Vital Signs ED Triage Vitals  Enc Vitals Group     BP 01/17/20 1218 119/77     Pulse Rate 01/17/20 1218 76     Resp 01/17/20 1218 18     Temp 01/17/20 1218 99 F (37.2 C)     Temp Source 01/17/20 1218 Oral     SpO2 01/17/20 1218 98 %     Weight 01/17/20 1214 259 lb 14.8 oz (117.9 kg)     Height 01/17/20 1214 5\' 7"  (1.702 m)     Head Circumference --      Peak Flow --      Pain Score 01/17/20 1214 0     Pain Loc --      Pain Edu? --      Excl. in GC? --    Updated Vital Signs BP 119/77 (BP Location: Left Arm)   Pulse 76   Temp 99 F (37.2 C) (Oral)   Resp 18   Ht 5\' 7"  (1.702 m)   Wt 117.9 kg   LMP 12/27/2019 (Approximate)   SpO2 98%   BMI 40.71 kg/m   Visual Acuity Right Eye Distance:   Left Eye Distance:   Bilateral Distance:    Right Eye Near:   Left Eye Near:    Bilateral Near:     Physical Exam Constitutional:      General: She is not in acute distress.    Appearance: Normal appearance. She is obese. She is not ill-appearing.  Eyes:     General:        Right eye: No discharge.        Left eye: No discharge.     Conjunctiva/sclera: Conjunctivae normal.  Cardiovascular:     Rate and Rhythm: Normal rate and regular rhythm.     Heart sounds: No murmur heard.   Pulmonary:     Effort: Pulmonary effort is normal.     Breath sounds: Normal breath sounds. No wheezing, rhonchi or rales.  Abdominal:     General: There is no distension.     Palpations: Abdomen is soft.     Tenderness: There is no abdominal tenderness.  Neurological:  Mental Status: She is alert.  Psychiatric:        Behavior: Behavior normal.    UC Treatments / Results  Labs (all labs ordered are listed, but only abnormal results are displayed) Labs Reviewed  WET PREP, GENITAL - Abnormal; Notable for the following components:      Result Value   Yeast Wet Prep HPF POC PRESENT (*)    Clue Cells Wet Prep HPF POC PRESENT (*)    WBC, Wet Prep HPF POC FEW (*)    All other components within normal limits    EKG   Radiology No results found.  Procedures Procedures (including critical care time)  Medications Ordered in UC Medications - No data to display  Initial Impression / Assessment and Plan / UC Course  I have reviewed the triage vital signs and the nursing notes.  Pertinent labs & imaging results that were available during my care of the patient were reviewed by me and considered in my medical decision making (see chart for details).    39 year old female presents with yeast vaginitis and bacterial vaginosis.  Treating with Flagyl and Diflucan.  Final Clinical Impressions(s) / UC Diagnoses   Final diagnoses:  Yeast vaginitis  Bacterial vaginosis   Discharge Instructions   None    ED Prescriptions    Medication Sig Dispense Auth. Provider   metroNIDAZOLE (FLAGYL) 500 MG tablet Take 1 tablet (500 mg total) by mouth 2 (two) times daily. 14 tablet Khameron Gruenwald G, DO   fluconazole (DIFLUCAN) 150 MG tablet Take 1 tablet (150 mg total) by mouth once for 1 dose. Repeat dose in 72 hours. 2 tablet Tommie Sams, DO     PDMP not reviewed this encounter.   Tommie Sams, [place] 01/17/20 1303

## 2020-01-17 NOTE — ED Triage Notes (Signed)
Pt c/o vaginal itching, and burning. Started yesterday. She has not tried anything. Denies antibiotic use. She states her blood sugars have been high.

## 2020-05-31 ENCOUNTER — Other Ambulatory Visit: Payer: Self-pay

## 2020-05-31 DIAGNOSIS — N641 Fat necrosis of breast: Secondary | ICD-10-CM

## 2020-07-17 ENCOUNTER — Encounter: Payer: Medicaid Other | Admitting: Obstetrics and Gynecology

## 2020-07-17 ENCOUNTER — Encounter: Payer: Self-pay | Admitting: Obstetrics and Gynecology

## 2020-07-19 ENCOUNTER — Encounter: Payer: Self-pay | Admitting: Obstetrics and Gynecology

## 2020-07-24 DIAGNOSIS — O09523 Supervision of elderly multigravida, third trimester: Secondary | ICD-10-CM | POA: Insufficient documentation

## 2020-07-30 ENCOUNTER — Other Ambulatory Visit: Payer: Self-pay

## 2020-07-30 ENCOUNTER — Encounter: Payer: Self-pay | Admitting: *Deleted

## 2020-07-30 ENCOUNTER — Encounter: Payer: Medicaid Other | Admitting: *Deleted

## 2020-07-30 VITALS — BP 100/70 | Ht 68.0 in | Wt 251.2 lb

## 2020-07-30 DIAGNOSIS — O24414 Gestational diabetes mellitus in pregnancy, insulin controlled: Secondary | ICD-10-CM | POA: Diagnosis not present

## 2020-07-30 DIAGNOSIS — Z3A10 10 weeks gestation of pregnancy: Secondary | ICD-10-CM | POA: Diagnosis not present

## 2020-07-30 DIAGNOSIS — O24911 Unspecified diabetes mellitus in pregnancy, first trimester: Secondary | ICD-10-CM | POA: Insufficient documentation

## 2020-07-30 DIAGNOSIS — O24111 Pre-existing diabetes mellitus, type 2, in pregnancy, first trimester: Secondary | ICD-10-CM

## 2020-07-30 NOTE — Progress Notes (Signed)
Diabetes Self-Management Education  Visit Type: First/Initial  Appt. Start Time: 0845 Appt. End Time: 1020  07/30/2020  [name], identified by name and date of birth, is a 40 y.o. female with a diagnosis of Diabetes: Type 2 (Pregnant).   ASSESSMENT  Blood pressure 100/70, height 5\' 8"  (1.727 m), weight 251 lb 3.2 oz (113.9 kg), last menstrual period 05/09/2020, estimated date of delivery 02/22/2021 Body mass index is 38.19 kg/m.   Diabetes Self-Management Education - 07/30/20 1111      Visit Information   Visit Type First/Initial      Initial Visit   Diabetes Type Type 2   Pregnant   Are you currently following a meal plan? Yes    What type of meal plan do you follow? "Tyring to keep as low carb as possible"    Are you taking your medications as prescribed? Yes    Date Diagnosed Type 2 diagnosis "several years"      Health Coping   How would you rate your overall health? Fair      Psychosocial Assessment   Patient Belief/Attitude about Diabetes Other (comment)   "frustrated"   Self-care barriers None    Self-management support Doctor's office;Family    Patient Concerns Nutrition/Meal planning;Glycemic Control    Special Needs Large print    Preferred Learning Style Visual    Learning Readiness Contemplating    How often do you need to have someone help you when you read instructions, pamphlets, or other written materials from your doctor or pharmacy? 2 - Rarely   Pt wasn't able to see the lines on the syringe even with a magnifier.   What is the last grade level you completed in school? some college      Pre-Education Assessment   Patient understands the diabetes disease and treatment process. Needs Review    Patient understands incorporating nutritional management into lifestyle. Needs Instruction    Patient undertands incorporating physical activity into lifestyle. Needs Review    Patient understands using medications safely. Needs Instruction    Patient  understands monitoring blood glucose, interpreting and using results Needs Review    Patient understands prevention, detection, and treatment of acute complications. Needs Instruction    Patient understands prevention, detection, and treatment of chronic complications. Needs Instruction    Patient understands how to develop strategies to address psychosocial issues. Needs Instruction    Patient understands how to develop strategies to promote health/change behavior. Needs Instruction      Complications   Last HgB A1C per patient/outside source 6.5 %   06/27/2020 - improvement from 8.2% on 12/01/221   How often do you check your blood sugar? 1-2 times/day   Pt reports she wasn't aware that she needed to check 2 hours after each meal. She has only been doing after breakfast.   Fasting Blood glucose range (mg/dL) 01-03-1999   FBG's 269-485 mg/dL - 462-703 elevated   Postprandial Blood glucose range (mg/dL) 50/09   pp breakfast 137-215 mg/dL 5/5 elevated   Have you had a dilated eye exam in the past 12 months? Yes    Are you checking your feet? No      Dietary Intake   Breakfast reports "maybe eating 1 meal/day and snacks - waffle with peanut butter or apple with peanut butter or smoothie    Lunch chickien sandwich from [employer]  fruit (apples, grapes,strawberries) with peanut butter or cheese    Dinner pork chops, chicken; potatoes - likes most foods but eat  these during this pregnancy (peas, rice, asparagus, green beans, brussels sprouts, broccoli, cauliflower)    Beverage(s) water, juice      Exercise   Exercise Type ADL's   will start swimming again this week - 3 x week for 30 min - 2 hrs     Patient Education   Previous Diabetes Education Yes (please comment)   had prior GDM requiring insulin 7 years ago   Disease state  Definition of diabetes, type 1 and 2, and the diagnosis of diabetes;Explored patient's options for treatment of their diabetes    Nutrition management  Role of  diet in the treatment of diabetes and the relationship between the three main macronutrients and blood glucose level;Food label reading, portion sizes and measuring food.;Reviewed blood glucose goals for pre and post meals and how to evaluate the patients' food intake on their blood glucose level.;Meal timing in regards to the patients' current diabetes medication.;Other (comment)   Don't do keto diet during pregnancy   Physical activity and exercise  Role of exercise on diabetes management, blood pressure control and cardiac health.    Medications Taught/reviewed insulin injection, site rotation, insulin storage and needle disposal.;Reviewed patients medication for diabetes, action, purpose, timing of dose and side effects.   Pt was unable to see lines on syringe even with magnifier. She didn't give her insulin last time with Gestational. Her ex-husband did. Instructed on insulin pen and she could count clicks to dosage. She gave herself 5 units NS to left abdomen subcutaneos   Monitoring Purpose and frequency of SMBG.;Taught/discussed recording of test results and interpretation of SMBG.;Identified appropriate SMBG and/or A1C goals.;Ketone testing, when, how.    Acute complications Taught treatment of hypoglycemia - the 15 rule.    Chronic complications Relationship between chronic complications and blood glucose control    Psychosocial adjustment Identified and addressed patients feelings and concerns about diabetes    Preconception care Pregnancy and GDM  Role of pre-pregnancy blood glucose control on the development of the fetus;Reviewed with patient blood glucose goals with pregnancy;Role of family planning for patients with diabetes    Personal strategies to promote health Other (comment)   She may have to have her father administer her insulin but he is not available at all times. Called her to let her know that her father can draw up insulin syringes and leave in the refrigerator but she will need  to take 2 injections (N & R) twice a day. She also reports her pharmacy doesn't have her insulin in pens.      Individualized Goals (developed by patient)   Reducing Risk Other (comment)   Improve blood sugars     Outcomes   Expected Outcomes Demonstrated interest in learning. Expect positive outcomes    Future DMSE 2 wks           Individualized Plan for Diabetes Self-Management Training:   Learning Objective:  Patient will have a greater understanding of diabetes self-management. Patient education plan is to attend individual and/or group sessions per assessed needs and concerns.   Plan:   Patient Instructions  Read booklet on Diabetes Management for Mothers-To-Be Follow Gestational Meal Planning Guidelines Avoid fruit juice unless treating a low blood sugar Complete a 3 Day Food Record and bring to next appointment Check blood sugars 4 x day - before breakfast and 2 hrs after every meal and record  Bring blood sugar log to all appointments Purchase urine ketone strips if instructed by MD and check urine ketones  every am:  If + increase bedtime snack to 1 protein and 2 carbohydrate servings Walk 20-30 minutes at least 5 x week if permitted by MD Carry fast acting glucose and a snack at all times Rotate injection sites Give morning insulin 30 minutes before breakfast and evening insulin 30 minutes before supper Roll N Insulin (cloudy) - don't shake If using insulin pens - hold in place for 5-10 seconds after injection  Morning insulin Humulin/Novolin    N  (cloudy)     30 units Humulin/Novolin    R  (clear)      15 units                                            45 units  Evening insulin Humulin/Novolin    N (cloudy)     10 units Humulin/Novolin    R (clear)      10 units                    20 units  Expected Outcomes:  Demonstrated interest in learning. Expect positive outcomes  Education material provided: Booklet Diabetes Management for Mothers-To-Be Gestational  Meal Planning Guidelines Simple Meal Plan 3 Day Food Record Goals for a Healthy Pregnancy Glucose tablets Symptoms, causes and treatments of Hypoglycemia Injection Guide (BD)  If problems or questions, patient to contact team via:  Sharion Settler, RN, CCM, CDCES [phone]  Future DSME appointment: 2 wks  August 14, 2020 with the dietitian

## 2020-07-30 NOTE — Patient Instructions (Signed)
Read booklet on Diabetes Management for Mothers-To-Be Follow Gestational Meal Planning Guidelines Avoid fruit juice unless treating a low blood sugar Complete a 3 Day Food Record and bring to next appointment Check blood sugars 4 x day - before breakfast and 2 hrs after every meal and record  Bring blood sugar log to all appointments Purchase urine ketone strips if instructed by MD and check urine ketones every am:  If + increase bedtime snack to 1 protein and 2 carbohydrate servings Walk 20-30 minutes at least 5 x week if permitted by MD Carry fast acting glucose and a snack at all times Rotate injection sites Give morning insulin 30 minutes before breakfast and evening insulin 30 minutes before supper Roll N Insulin (cloudy) - don't shake If using insulin pens - hold in place for 5-10 seconds after injection  Morning insulin Humulin/Novolin    N  (cloudy)     30 units Humulin/Novolin    R  (clear)      15 units                                            45 units  Evening insulin Humulin/Novolin    N (cloudy)     10 units Humulin/Novolin    R (clear)      10 units                    20 units

## 2020-08-01 ENCOUNTER — Telehealth: Payer: Self-pay | Admitting: *Deleted

## 2020-08-01 NOTE — Telephone Encounter (Signed)
Phone call to follow up with insulin administration. She reports that her blood sugars are still high 100-200's mg/dL fasting and post meals. She just checked her blood sugar before this call and reading was 130 mg/dl. No reports of hypoglycemia. She reports she gives herself the injections if she can't find someone else (usually her Dad) to do them. She is taking 2 injections of N and R before breakfast and before supper. Her Dad wasn't instructed to mix insulin and she is not able to see the lines on the syringe. Instructed her to continue charting her numbers and send to [facility] on Mon since her appt is not until 6/23. MD will most likely need to increase her insulin dosages. She sees the dietitian on 6/15. Instructed her to call for any questions.

## 2020-08-13 ENCOUNTER — Ambulatory Visit (HOSPITAL_BASED_OUTPATIENT_CLINIC_OR_DEPARTMENT_OTHER): Payer: Medicaid Other

## 2020-08-13 ENCOUNTER — Ambulatory Visit: Payer: Medicaid Other | Attending: Obstetrics | Admitting: Obstetrics

## 2020-08-13 ENCOUNTER — Other Ambulatory Visit: Payer: Self-pay | Admitting: Obstetrics and Gynecology

## 2020-08-13 ENCOUNTER — Other Ambulatory Visit: Payer: Self-pay

## 2020-08-13 ENCOUNTER — Ambulatory Visit: Payer: Medicaid Other

## 2020-08-13 DIAGNOSIS — Z3A12 12 weeks gestation of pregnancy: Secondary | ICD-10-CM | POA: Diagnosis not present

## 2020-08-13 DIAGNOSIS — Z3A00 Weeks of gestation of pregnancy not specified: Secondary | ICD-10-CM

## 2020-08-13 DIAGNOSIS — E079 Disorder of thyroid, unspecified: Secondary | ICD-10-CM

## 2020-08-13 DIAGNOSIS — Q131 Absence of iris: Secondary | ICD-10-CM

## 2020-08-13 DIAGNOSIS — O99211 Obesity complicating pregnancy, first trimester: Secondary | ICD-10-CM

## 2020-08-13 DIAGNOSIS — O09891 Supervision of other high risk pregnancies, first trimester: Secondary | ICD-10-CM | POA: Diagnosis not present

## 2020-08-13 DIAGNOSIS — O24111 Pre-existing diabetes mellitus, type 2, in pregnancy, first trimester: Secondary | ICD-10-CM

## 2020-08-13 DIAGNOSIS — O09291 Supervision of pregnancy with other poor reproductive or obstetric history, first trimester: Secondary | ICD-10-CM

## 2020-08-13 DIAGNOSIS — O99281 Endocrine, nutritional and metabolic diseases complicating pregnancy, first trimester: Secondary | ICD-10-CM

## 2020-08-13 DIAGNOSIS — O10911 Unspecified pre-existing hypertension complicating pregnancy, first trimester: Secondary | ICD-10-CM

## 2020-08-13 DIAGNOSIS — O09521 Supervision of elderly multigravida, first trimester: Secondary | ICD-10-CM

## 2020-08-13 NOTE — Progress Notes (Addendum)
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  The patient will be 40 years old at the time of delivery.  This note summarizes the information we discussed. The patient was present at her visit alone today.   We explained that the chance of a chromosome abnormality increases with maternal age.  Chromosomes and examples of chromosome problems were reviewed.  Humans typically have 46 chromosomes in each cell, with half passed through each sperm and egg.  Any change in the number or structure of chromosomes can increase the risk of problems in the physical and mental development of a pregnancy.   Based upon age of the patient and the current gestational age, the chance of any chromosome abnormality was 1 in 217. The chance of Down syndrome, the most common chromosome problem associated with maternal age, was 1 in 1232.  The risk of chromosome problems is in addition to the 3% general population risk for birth defects and intellectual disabilities.  The greatest chance, of course, is that the baby would be born in good health.  We discussed the following prenatal screening and testing options for this pregnancy:  Cell free fetal DNA testing analyzes maternal blood to determine whether or not the baby may have Down syndrome, trisomy 8813, or trisomy 5918.  This test utilizes a maternal blood sample and DNA sequencing technology to isolate circulating cell free fetal DNA from maternal plasma.  The fetal DNA can then be analyzed for DNA sequences that are derived from the three most common chromosomes involved in aneuploidy, chromosomes 13, 18, and 21.  If the overall amount of DNA is greater than the expected level for any of these chromosomes, aneuploidy is suspected.  While we do not consider it a replacement for  invasive testing and karyotype analysis, a negative result from this testing would be reassuring, though not a guarantee of a normal chromosome complement for the baby.  An abnormal result is certainly suggestive of an abnormal chromosome complement, though we would still recommend CVS or amniocentesis to confirm any findings from this testing.  Stacie Keller had prior cell free fetal DNA testing in this pregnancy which was negative for Trisomy 6413, 18 and 21.  Results were also low risk for sex chromosome aneuploidies and revealed the fetal gender to be female.  This result significantly reduces the chance for these conditions in the pregnancy, but is still considered a screening test.   The chorionic villus sampling procedure is available for first trimester chromosome analysis.  This involves the withdrawal of a small amount of chorionic villi (tissue from the developing placenta).  Risk of pregnancy loss is estimated to be approximately 1 in 200 to 1 in 100 (0.5 to 1%).  There is approximately a 1% (1 in 100) chance that the CVS chromosome results will be unclear.  Chorionic villi cannot be tested for neural tube defects.     Maternal serum marker screening, a blood test that measures pregnancy proteins, can provide risk assessments for Down syndrome, trisomy 18, and open neural tube defects (spina bifida, anencephaly). Because it does not directly examine the fetus, it cannot positively diagnose or rule out these problems. The detection rate is approximately 75% for Down syndrome, 70% for trisomy 18 and 80% of open neural tube defects. If prior chromosome screening has  been performed, then AFP only is recommended to test for open neural tube defects alone.  Targeted ultrasound uses high frequency sound waves to create an image of the developing fetus.  An ultrasound is often recommended as a routine means of evaluating the pregnancy.  It is also used to screen for fetal anatomy problems (for example, a heart  defect) that might be suggestive of a chromosomal or other abnormality.   Amniocentesis involves the removal of a small amount of amniotic fluid from the sac surrounding the fetus with the use of a thin needle inserted through the maternal abdomen and uterus.  Ultrasound guidance is used throughout the procedure.  Fetal cells from amniotic fluid are directly evaluated and > 99.5% of chromosome problems and > 98% of open neural tube defects can be detected. This procedure is generally performed after the 15th week of pregnancy.  The main risks to this procedure include complications leading to miscarriage in less than 1 in 200 cases (0.5%).  Cystic Fibrosis and Spinal Muscular Atrophy (SMA) screening were also discussed with the patient. Both conditions are recessive, which means that both parents must be carriers in order to have a child with the disease.  Cystic fibrosis (CF) is one of the most common genetic conditions in persons of Caucasian ancestry.  This condition occurs in approximately 1 in 2,500 Caucasian persons and results in thickened secretions in the lungs, digestive, and reproductive systems.  For a baby to be at risk for having CF, both of the parents must be carriers for this condition.  Approximately 1 in 32 Caucasian persons is a carrier for CF.  Current carrier testing looks for the most common mutations in the gene for CF and can detect approximately 90% of carriers in the Caucasian population.  This means that the carrier screening can greatly reduce, but cannot eliminate, the chance for an individual to have a child with CF.  If an individual is found to be a carrier for CF, then carrier testing would be available for the partner. As part of [place]'s newborn screening profile, all babies born in the state of [place] will have a two-tier screening process.  Specimens are first tested to determine the concentration of immunoreactive trypsinogen (IRT).  The top 5% of specimens  with the highest IRT values then undergo DNA testing using a panel of over 40 common CF mutations. SMA is a neurodegenerative disorder that leads to atrophy of skeletal muscle and overall weakness.  This condition is also more prevalent in the Caucasian population, with 1 in 40-1 in 60 persons being a carrier and 1 in 6,000-1 in 10,000 children being affected.  There are multiple forms of the disease, with some causing death in infancy to other forms with survival into adulthood.  The genetics of SMA is complex, but carrier screening can detect up to 95% of carriers in the Caucasian population.  Similar to CF, a negative result can greatly reduce, but cannot eliminate, the chance to have a child with SMA.  Hemoglobinopathy screening was also offered to the patient.   We obtained a detailed family history and pregnancy history.  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 Her mother and brother both have the condition as well. 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  This condition is inherited as a dominant condition, meaning  that it is caused by a change in one copy of the gene for PAX6.  For each pregnancy, there is a 50% chance that she will pass on the normal copy of the gene (and the baby would not have the condition) and a 50% chance that the baby will inherit the gene for aniridia. Because genetic testing was performed, it is possible to provide prenatal diagnosis for this condition through CVS or amniocentesis (though we do not have a copy of this result and would need to obtain that prior to the testing).  Stacie Keller declined prenatal testing for this condition, as it would not change her management of the pregnancy.  Also in the family, she reported a distant cousin with spina bifida.  We reviewed the multifactorial inheritance of open neural tube defects and  fact that for relatives more distant than 3rd degree (first cousins), the recurrence chance is not expected to be any higher than the general population risk of 1 in 500 to 1 in 1,000. She also stated that her 97 year old daughter, Stacie Keller, had a normal eye exam as an infant and continues to have normal vision, so it is presumed that she did not inherit the PAX6 variant.  She has begun to show ADHD, episodes of rage and sensory problems since she was diagnosed with Lyme disease. Stacie Keller reports that her doctors tested her for this after she had recurrent fevers and arthritis and the results indicated that she likely had the condition for three years before she was tested.  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  She is also considering having Stacie Keller evaluated for autism spectrum disorders in the near future.  I encouraged her to speak with her Pediatrician about her concerns and find out if a referral is possible.  Though there have been associations between Lyme disease and developmental or behavioral effects, there may also be inherited or other causes for these differences, so additional evaluation would be reasonable. If more is learned about her condition, we are happy to discuss this again.  Lastly, the patient reported that the sperm donor may have sickle cell trait.  He is of [place] background and is said to otherwise be in good health. There was little information about his family history.  We talked about the recessive inheritance of hemoglobinopathies and the chance for Stacie Keller to be a carrier given her Caucasian background (approximately 1 in 21).  She has had no carrier screening, and has an MCV of 80.  We offered the option of carrier screening with prenatal diagnosis if both are carriers or waiting until delivery to have testing through newborn screening. The patient prefers to wait until delivery. The remainder of the family history is  unremarkable for birth defects, developmental delays, recurrent pregnancy loss or known chromosome abnormalities.  Stacie Keller stated that this is her third pregnancy.  See the MFM consultation note for recommendations and detailed obstetric history.  After consideration of the options, Stacie Keller elected to proceed with an ultrasound only and to decline carrier screening and prenatal diagnosis.  An ultrasound was performed at the time of the visit.  Please refer to the ultrasound report for details of that study.  Stacie Keller was encouraged to call with questions or concerns.  We can be contacted at [phone].  Plan of Care: Aniridia:  Pt declined amniocentesis or CVS for this condition.  Recommend eye exam on the baby after delivery.  AMA: negative NIPS, declined amniocentesis or CVS. Carrier screening: patient declined carrier testing for CF, SMA and hemoglobinopathies.  Sperm donor may be sickle cell carrier. Prefers testing through newborn screening. Scheduled to return to clinic  at 19 weeks for fetal anatomy ultrasound msAFP only to be drawn by OB if desired for spina bifida screening (distant family history).   Cherly Anderson, MS, CGC

## 2020-08-14 ENCOUNTER — Encounter: Payer: Self-pay | Admitting: Dietician

## 2020-08-14 ENCOUNTER — Encounter: Payer: Medicaid Other | Admitting: Dietician

## 2020-08-14 VITALS — BP 110/68 | Ht 68.0 in | Wt 250.4 lb

## 2020-08-14 DIAGNOSIS — O24111 Pre-existing diabetes mellitus, type 2, in pregnancy, first trimester: Secondary | ICD-10-CM | POA: Diagnosis present

## 2020-08-14 DIAGNOSIS — Z713 Dietary counseling and surveillance: Secondary | ICD-10-CM | POA: Insufficient documentation

## 2020-08-14 NOTE — Progress Notes (Signed)
MFM Consult Note  Stacie Keller is a 40 year old gravida 3 para 0-2-0-1 currently at 12 weeks and 3 days ([facility] February 22, 2021).  She was seen in consultation today due to advanced maternal age, maternal obesity, pregestational diabetes treated with insulin, hypothyroidism, chronic hypertension treated with metoprolol, and history of preeclampsia in a prior pregnancy.  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  The patient reports that she was diagnosed with pregestational diabetes at least 5 or 6 years ago.  She had been treated with metformin outside of pregnancy.  She is currently treated with an insulin regimen of NPH 90 units daily and regular insulin 60 units daily.  Her hemoglobin A1c drawn earlier in her pregnancy was 6.5%.  She also has a history of chronic hypertension for many years.  She is currently treated with metoprolol 100 mg daily.  The patient reports that she was diagnosed with severe preeclampsia in her first pregnancy requiring an indicated preterm birth at 11+ weeks.  She required a classical C-section for that delivery.  Unfortunately, that child did not survive after birth due to bilateral severe intracranial hemorrhage.  She was delivered at 36 weeks and 4 days in her second pregnancy due to possible cholestasis of pregnancy.  She did not develop preeclampsia in her second pregnancy.  She is taking a daily baby aspirin for preeclampsia prophylaxis.  She is currently treated with Synthroid 200 mcg daily for hypothyroidism.  Her past surgical history includes cataract surgery as a child due to aniridia, classical C-section, appendectomy, and cholecystectomy.  Current medications include insulin, Synthroid, vitamin D, prenatal vitamins, metoprolol, and daily baby aspirin.    She had a cell free DNA test drawn earlier in her current pregnancy ([id number]) indicating a low risk for trisomy 30, 18, and 13.  A  female fetus is predicted.  The patient's BMI is 38.  An ultrasound performed today shows a viable singleton intrauterine pregnancy.  The crown-rump length measured today confirms her EDC of February 22, 2021.  During our consultation today the following were discussed:  Pregestational diabetes and pregnancy  The implications and management of diabetes in pregnancy was discussed in detail with the patient. She was reassured that diabetes is a common condition that can be managed in pregnancy.  She was advised that our goals for her fingerstick values are fasting values of 90-95 or less and two-hour postprandials of 120 or less.  Should the majority of her fingerstick values be above these values, her insulin dosage may have to be adjusted to help her achieve better glycemic control. The patient was advised that getting her fingerstick values as close to these goals as possible would provide her with the most optimal obstetrical outcome.  A fetal anatomy scan has been scheduled at around 19 weeks.  A fetal echocardiogram will also be scheduled with pediatric cardiology following her fetal anatomy scan.  We will then continue to follow her with monthly growth ultrasounds. Twice-weekly fetal testing should be started at around 32 weeks.    The increased risk of congenital birth defects such as spina bifida and cardiac defects associated with diabetes in pregnancy was discussed. An alpha-fetoprotein test may be ordered at around 16 weeks to screen for spina bifida.  Chronic hypertension in pregnancy  The implications and management of chronic hypertension in pregnancy was discussed. The patient was advised that should her blood pressures continue to be elevated;  the dosage of metoprolol may need to be increased.  The increased risk of superimposed preeclampsia, an indicated preterm delivery, and possible fetal growth restriction due to chronic hypertension in pregnancy was discussed. The patient was  advised that we will continue to follow her closely throughout her pregnancy.  She should continue taking a daily baby aspirin (81 mg daily) for preeclampsia prophylaxis.  Due to diabetes and chronic hypertension along with her history of severe preeclampsia in a prior pregnancy, she should have a baseline 24-hour urine collected as soon as possible to assess for total protein.  Hypothyroidism and pregnancy  The patient was advised to continue taking Synthroid as recommended.  She should have frequent monitoring of her thyroid function tests.  Advanced maternal age in pregnancy  The increased risk of fetal aneuploidy due to advanced maternal age was discussed.  She understands definitive diagnosis of fetal aneuploidy can only be made via an amniocentesis.  The patient stated that she will most likely decline this procedure as she is comfortable with her negative cell free DNA test.  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  As it is inherited in an autosomal dominant fashion, there is a 50% chance that her baby will have the same condition.  Her baby should be examined after birth to determine if she has aniridia  History of a prior classical cesarean delivery  The increased risk of uterine rupture should she go into labor due to her prior classical incision was discussed.  Due to her history of a prior classical incision, she should undergo a repeat cesarean delivery at around 37 weeks.  She understands an earlier delivery may be indicated should she develop any complications in her pregnancy such as preeclampsia.  At the end of the consultation, the patient stated that all of her questions have been answered to her complete satisfaction.    Thank you for referring this patient for a Maternal-Fetal Medicine consultation.    A total of 60 minutes was spent counseling  and coordinating the care for this patient.  Greater than 50% of the time was spent in direct face-to-face contact.  Recommendations:  -Continue current medications for treatment of diabetes, chronic hypertension, and hypothyroidism -Continue daily baby aspirin for preeclampsia prophylaxis -Collect baseline 24-hour urine as soon as possible -Detailed fetal anatomy scan scheduled at 19 weeks -Fetal echocardiogram -Monthly growth ultrasounds -Twice-weekly fetal testing to start at 32 weeks -Repeat cesarean delivery at 37 weeks or earlier should she develop complications in her pregnancy

## 2020-08-14 NOTE — Patient Instructions (Signed)
Great job including protein along with healthy carbs! Continue to eat about every 3-4 hours, small amounts are fine.  Easy protein options -- low fat or fat free cream cheese, cottage cheese, hummus as dip for veggies or crackers, peanut butter, nuts.

## 2020-08-14 NOTE — Progress Notes (Signed)
Patient's BG record indicates fasting BGs ranging 94-135, and post-meal BGs ranging 90-230. BGs have improved with recently increased insulin doses. Patient reports significant lower back pain in the past several days. She has contacted her physician's office regarding options for relief. Discussed effect of pain on BG control. Patient's food diary indicates small food portions due to low appetite and early satiety, which occurred with previous pregnancy also. She is including protein sources and healthy carb foods when eating. Fruit and cheese appeal the most at this time. Discussed other options for quick, cool and fresh foods, and ways to include vegetables. Provided basic balanced meal plan, and wrote individualized menus based on patient's food preferences. Instructed patient on food safety, including avoidance of Listeriosis, and limiting mercury from fish. Patient demonstrated understanding of proper treatment for hypoglycemia. She reports some hypoglycemic symptoms but so far BG has not been below normal.

## 2020-08-15 ENCOUNTER — Institutional Professional Consult (permissible substitution): Payer: Medicaid Other

## 2020-09-26 ENCOUNTER — Other Ambulatory Visit: Payer: Self-pay | Admitting: Obstetrics

## 2020-09-26 DIAGNOSIS — O09522 Supervision of elderly multigravida, second trimester: Secondary | ICD-10-CM

## 2020-09-26 DIAGNOSIS — O09293 Supervision of pregnancy with other poor reproductive or obstetric history, third trimester: Secondary | ICD-10-CM

## 2020-09-26 DIAGNOSIS — O34219 Maternal care for unspecified type scar from previous cesarean delivery: Secondary | ICD-10-CM

## 2020-09-26 DIAGNOSIS — O99212 Obesity complicating pregnancy, second trimester: Secondary | ICD-10-CM

## 2020-09-26 DIAGNOSIS — O09899 Supervision of other high risk pregnancies, unspecified trimester: Secondary | ICD-10-CM

## 2020-09-26 DIAGNOSIS — O10919 Unspecified pre-existing hypertension complicating pregnancy, unspecified trimester: Secondary | ICD-10-CM

## 2020-09-26 DIAGNOSIS — O24112 Pre-existing diabetes mellitus, type 2, in pregnancy, second trimester: Secondary | ICD-10-CM

## 2020-09-26 DIAGNOSIS — E039 Hypothyroidism, unspecified: Secondary | ICD-10-CM

## 2020-10-01 ENCOUNTER — Other Ambulatory Visit: Payer: Self-pay

## 2020-10-01 ENCOUNTER — Ambulatory Visit: Payer: Medicaid Other | Attending: Obstetrics and Gynecology

## 2020-10-01 ENCOUNTER — Other Ambulatory Visit: Payer: Medicaid Other

## 2020-10-01 VITALS — BP 110/68 | Temp 98.1°F | Resp 20 | Ht 67.0 in | Wt 253.0 lb

## 2020-10-01 DIAGNOSIS — O10919 Unspecified pre-existing hypertension complicating pregnancy, unspecified trimester: Secondary | ICD-10-CM | POA: Diagnosis not present

## 2020-10-01 DIAGNOSIS — E039 Hypothyroidism, unspecified: Secondary | ICD-10-CM

## 2020-10-01 DIAGNOSIS — Z3A19 19 weeks gestation of pregnancy: Secondary | ICD-10-CM | POA: Insufficient documentation

## 2020-10-01 DIAGNOSIS — E669 Obesity, unspecified: Secondary | ICD-10-CM | POA: Insufficient documentation

## 2020-10-01 DIAGNOSIS — O24112 Pre-existing diabetes mellitus, type 2, in pregnancy, second trimester: Secondary | ICD-10-CM | POA: Diagnosis present

## 2020-10-01 DIAGNOSIS — O09522 Supervision of elderly multigravida, second trimester: Secondary | ICD-10-CM | POA: Diagnosis not present

## 2020-10-01 DIAGNOSIS — O99282 Endocrine, nutritional and metabolic diseases complicating pregnancy, second trimester: Secondary | ICD-10-CM | POA: Diagnosis not present

## 2020-10-01 DIAGNOSIS — O99212 Obesity complicating pregnancy, second trimester: Secondary | ICD-10-CM | POA: Insufficient documentation

## 2020-10-01 DIAGNOSIS — O34219 Maternal care for unspecified type scar from previous cesarean delivery: Secondary | ICD-10-CM

## 2020-10-01 DIAGNOSIS — E119 Type 2 diabetes mellitus without complications: Secondary | ICD-10-CM | POA: Insufficient documentation

## 2020-10-01 DIAGNOSIS — O09293 Supervision of pregnancy with other poor reproductive or obstetric history, third trimester: Secondary | ICD-10-CM | POA: Diagnosis not present

## 2020-10-01 DIAGNOSIS — O09299 Supervision of pregnancy with other poor reproductive or obstetric history, unspecified trimester: Secondary | ICD-10-CM

## 2020-10-01 DIAGNOSIS — O09899 Supervision of other high risk pregnancies, unspecified trimester: Secondary | ICD-10-CM

## 2020-10-29 ENCOUNTER — Other Ambulatory Visit: Payer: Self-pay | Admitting: Obstetrics and Gynecology

## 2020-10-29 DIAGNOSIS — O99212 Obesity complicating pregnancy, second trimester: Secondary | ICD-10-CM

## 2020-10-29 DIAGNOSIS — O34219 Maternal care for unspecified type scar from previous cesarean delivery: Secondary | ICD-10-CM

## 2020-10-29 DIAGNOSIS — E039 Hypothyroidism, unspecified: Secondary | ICD-10-CM

## 2020-10-29 DIAGNOSIS — O10919 Unspecified pre-existing hypertension complicating pregnancy, unspecified trimester: Secondary | ICD-10-CM

## 2020-10-29 DIAGNOSIS — O09522 Supervision of elderly multigravida, second trimester: Secondary | ICD-10-CM

## 2020-10-29 DIAGNOSIS — O24112 Pre-existing diabetes mellitus, type 2, in pregnancy, second trimester: Secondary | ICD-10-CM

## 2020-10-31 ENCOUNTER — Ambulatory Visit: Payer: Medicaid Other | Attending: Obstetrics

## 2020-10-31 ENCOUNTER — Other Ambulatory Visit: Payer: Self-pay

## 2020-10-31 DIAGNOSIS — Z3A25 25 weeks gestation of pregnancy: Secondary | ICD-10-CM | POA: Diagnosis not present

## 2020-10-31 DIAGNOSIS — O09522 Supervision of elderly multigravida, second trimester: Secondary | ICD-10-CM | POA: Diagnosis not present

## 2020-10-31 DIAGNOSIS — O34219 Maternal care for unspecified type scar from previous cesarean delivery: Secondary | ICD-10-CM | POA: Diagnosis not present

## 2020-10-31 DIAGNOSIS — O99282 Endocrine, nutritional and metabolic diseases complicating pregnancy, second trimester: Secondary | ICD-10-CM | POA: Diagnosis not present

## 2020-10-31 DIAGNOSIS — E119 Type 2 diabetes mellitus without complications: Secondary | ICD-10-CM

## 2020-10-31 DIAGNOSIS — E039 Hypothyroidism, unspecified: Secondary | ICD-10-CM | POA: Diagnosis not present

## 2020-10-31 DIAGNOSIS — O24112 Pre-existing diabetes mellitus, type 2, in pregnancy, second trimester: Secondary | ICD-10-CM | POA: Diagnosis present

## 2020-10-31 DIAGNOSIS — E669 Obesity, unspecified: Secondary | ICD-10-CM | POA: Diagnosis not present

## 2020-10-31 DIAGNOSIS — O10912 Unspecified pre-existing hypertension complicating pregnancy, second trimester: Secondary | ICD-10-CM | POA: Diagnosis not present

## 2020-10-31 DIAGNOSIS — Z3A23 23 weeks gestation of pregnancy: Secondary | ICD-10-CM

## 2020-10-31 DIAGNOSIS — O10019 Pre-existing essential hypertension complicating pregnancy, unspecified trimester: Secondary | ICD-10-CM | POA: Diagnosis not present

## 2020-10-31 DIAGNOSIS — O99212 Obesity complicating pregnancy, second trimester: Secondary | ICD-10-CM

## 2020-10-31 DIAGNOSIS — O10919 Unspecified pre-existing hypertension complicating pregnancy, unspecified trimester: Secondary | ICD-10-CM

## 2020-11-20 ENCOUNTER — Encounter: Payer: Self-pay | Admitting: Dietician

## 2020-11-20 ENCOUNTER — Telehealth: Payer: Self-pay | Admitting: Dietician

## 2020-11-20 NOTE — Progress Notes (Signed)
Patient does not feel she needs to return for nutrition therapy beyond her visit from 07/2020. Reviewed main nutritional concerns as outlined in telephone encounter. Sent notification to referring provider.

## 2020-11-20 NOTE — Telephone Encounter (Signed)
Spoke with patient to review nutritional progress in light of new MNT referral. Patient reports ongoing issue with early satiety when eating. She states she is eating at regular intervals and consuming as much as she can. Reviewed importance of adequate protein and goal of consuming frequent small meals and snacks, including sources of calcium, consuming cool-temperature foods as they are often more easily appealing and tolerated when appetite is low.  Discussed pregnancy weight gain goal of 10-12lbs with BMI of 38.  She does not feel additional MNT visits will help her at this time. Encouraged her to contact us if any other questions or concerns arise.

## 2020-11-22 ENCOUNTER — Other Ambulatory Visit: Payer: Self-pay | Admitting: Obstetrics

## 2020-11-22 DIAGNOSIS — O09899 Supervision of other high risk pregnancies, unspecified trimester: Secondary | ICD-10-CM

## 2020-11-22 DIAGNOSIS — O99212 Obesity complicating pregnancy, second trimester: Secondary | ICD-10-CM

## 2020-11-22 DIAGNOSIS — O99891 Other specified diseases and conditions complicating pregnancy: Secondary | ICD-10-CM

## 2020-11-22 DIAGNOSIS — O24112 Pre-existing diabetes mellitus, type 2, in pregnancy, second trimester: Secondary | ICD-10-CM

## 2020-11-22 DIAGNOSIS — O09522 Supervision of elderly multigravida, second trimester: Secondary | ICD-10-CM

## 2020-11-22 DIAGNOSIS — O99282 Endocrine, nutritional and metabolic diseases complicating pregnancy, second trimester: Secondary | ICD-10-CM

## 2020-11-22 DIAGNOSIS — E039 Hypothyroidism, unspecified: Secondary | ICD-10-CM

## 2020-11-22 DIAGNOSIS — O09292 Supervision of pregnancy with other poor reproductive or obstetric history, second trimester: Secondary | ICD-10-CM

## 2020-11-22 DIAGNOSIS — O34219 Maternal care for unspecified type scar from previous cesarean delivery: Secondary | ICD-10-CM

## 2020-11-28 ENCOUNTER — Ambulatory Visit: Payer: Medicaid Other | Attending: Maternal & Fetal Medicine

## 2020-11-28 ENCOUNTER — Other Ambulatory Visit: Payer: Self-pay

## 2020-11-28 DIAGNOSIS — O99282 Endocrine, nutritional and metabolic diseases complicating pregnancy, second trimester: Secondary | ICD-10-CM | POA: Diagnosis not present

## 2020-11-28 DIAGNOSIS — O24112 Pre-existing diabetes mellitus, type 2, in pregnancy, second trimester: Secondary | ICD-10-CM | POA: Diagnosis not present

## 2020-11-28 DIAGNOSIS — O10912 Unspecified pre-existing hypertension complicating pregnancy, second trimester: Secondary | ICD-10-CM | POA: Diagnosis not present

## 2020-11-28 DIAGNOSIS — Z3A27 27 weeks gestation of pregnancy: Secondary | ICD-10-CM | POA: Diagnosis not present

## 2020-11-28 DIAGNOSIS — O34219 Maternal care for unspecified type scar from previous cesarean delivery: Secondary | ICD-10-CM | POA: Insufficient documentation

## 2020-11-28 DIAGNOSIS — E039 Hypothyroidism, unspecified: Secondary | ICD-10-CM | POA: Diagnosis not present

## 2020-11-28 DIAGNOSIS — O09892 Supervision of other high risk pregnancies, second trimester: Secondary | ICD-10-CM | POA: Insufficient documentation

## 2020-11-28 DIAGNOSIS — O99891 Other specified diseases and conditions complicating pregnancy: Secondary | ICD-10-CM | POA: Diagnosis not present

## 2020-11-28 DIAGNOSIS — O09899 Supervision of other high risk pregnancies, unspecified trimester: Secondary | ICD-10-CM

## 2020-11-28 DIAGNOSIS — O09292 Supervision of pregnancy with other poor reproductive or obstetric history, second trimester: Secondary | ICD-10-CM | POA: Insufficient documentation

## 2020-11-28 DIAGNOSIS — O99212 Obesity complicating pregnancy, second trimester: Secondary | ICD-10-CM | POA: Insufficient documentation

## 2020-11-28 DIAGNOSIS — O09522 Supervision of elderly multigravida, second trimester: Secondary | ICD-10-CM | POA: Insufficient documentation

## 2020-12-12 ENCOUNTER — Other Ambulatory Visit: Payer: Self-pay

## 2020-12-12 DIAGNOSIS — O09523 Supervision of elderly multigravida, third trimester: Secondary | ICD-10-CM

## 2020-12-12 DIAGNOSIS — O24313 Unspecified pre-existing diabetes mellitus in pregnancy, third trimester: Secondary | ICD-10-CM

## 2020-12-12 DIAGNOSIS — E039 Hypothyroidism, unspecified: Secondary | ICD-10-CM

## 2020-12-17 ENCOUNTER — Other Ambulatory Visit: Payer: Self-pay

## 2020-12-17 ENCOUNTER — Ambulatory Visit: Payer: Medicaid Other | Attending: Obstetrics

## 2020-12-17 DIAGNOSIS — E039 Hypothyroidism, unspecified: Secondary | ICD-10-CM

## 2020-12-17 DIAGNOSIS — O09523 Supervision of elderly multigravida, third trimester: Secondary | ICD-10-CM | POA: Diagnosis not present

## 2020-12-17 DIAGNOSIS — O99283 Endocrine, nutritional and metabolic diseases complicating pregnancy, third trimester: Secondary | ICD-10-CM | POA: Diagnosis not present

## 2020-12-17 DIAGNOSIS — O24113 Pre-existing diabetes mellitus, type 2, in pregnancy, third trimester: Secondary | ICD-10-CM | POA: Diagnosis not present

## 2020-12-17 DIAGNOSIS — E119 Type 2 diabetes mellitus without complications: Secondary | ICD-10-CM | POA: Insufficient documentation

## 2020-12-17 DIAGNOSIS — O10013 Pre-existing essential hypertension complicating pregnancy, third trimester: Secondary | ICD-10-CM | POA: Diagnosis not present

## 2020-12-17 DIAGNOSIS — Z3A30 30 weeks gestation of pregnancy: Secondary | ICD-10-CM | POA: Diagnosis not present

## 2020-12-17 DIAGNOSIS — O24313 Unspecified pre-existing diabetes mellitus in pregnancy, third trimester: Secondary | ICD-10-CM

## 2020-12-27 ENCOUNTER — Encounter: Payer: Self-pay | Admitting: Obstetrics and Gynecology

## 2020-12-27 ENCOUNTER — Other Ambulatory Visit: Payer: Self-pay

## 2020-12-27 ENCOUNTER — Observation Stay
Admission: EM | Admit: 2020-12-27 | Discharge: 2020-12-27 | Disposition: A | Discharge status: Home / Self Care | Payer: Medicaid Other | Attending: Obstetrics | Admitting: Obstetrics

## 2020-12-27 DIAGNOSIS — O368130 Decreased fetal movements, third trimester, not applicable or unspecified: Principal | ICD-10-CM | POA: Insufficient documentation

## 2020-12-27 DIAGNOSIS — O139 Gestational [pregnancy-induced] hypertension without significant proteinuria, unspecified trimester: Secondary | ICD-10-CM | POA: Diagnosis not present

## 2020-12-27 DIAGNOSIS — Z794 Long term (current) use of insulin: Secondary | ICD-10-CM | POA: Diagnosis not present

## 2020-12-27 DIAGNOSIS — Z91040 Latex allergy status: Secondary | ICD-10-CM | POA: Diagnosis not present

## 2020-12-27 DIAGNOSIS — E039 Hypothyroidism, unspecified: Secondary | ICD-10-CM | POA: Diagnosis not present

## 2020-12-27 DIAGNOSIS — O99283 Endocrine, nutritional and metabolic diseases complicating pregnancy, third trimester: Secondary | ICD-10-CM | POA: Insufficient documentation

## 2020-12-27 DIAGNOSIS — Z79899 Other long term (current) drug therapy: Secondary | ICD-10-CM | POA: Diagnosis not present

## 2020-12-27 DIAGNOSIS — Z7982 Long term (current) use of aspirin: Secondary | ICD-10-CM | POA: Insufficient documentation

## 2020-12-27 DIAGNOSIS — O24113 Pre-existing diabetes mellitus, type 2, in pregnancy, third trimester: Secondary | ICD-10-CM | POA: Insufficient documentation

## 2020-12-27 DIAGNOSIS — Z3A31 31 weeks gestation of pregnancy: Secondary | ICD-10-CM | POA: Diagnosis not present

## 2020-12-27 LAB — GLUCOSE, CAPILLARY: Glucose-Capillary: 70 mg/dL (ref 70–99)

## 2020-12-27 MED ORDER — ZOLPIDEM TARTRATE 5 MG PO TABS: 5.0000 mg | ORAL_TABLET | Freq: Every evening | ORAL | Status: DC | PRN

## 2020-12-27 MED ORDER — ACETAMINOPHEN 325 MG PO TABS: 650.0000 mg | ORAL_TABLET | ORAL | Status: DC | PRN

## 2020-12-27 MED ORDER — CALCIUM CARBONATE ANTACID 500 MG PO CHEW: 2.0000 | CHEWABLE_TABLET | ORAL | Status: DC | PRN

## 2020-12-27 MED ORDER — PRENATAL MULTIVITAMIN CH: 1.0000 | ORAL_TABLET | Freq: Every day | ORAL | Status: DC

## 2020-12-27 MED ORDER — DOCUSATE SODIUM 100 MG PO CAPS: 100.0000 mg | ORAL_CAPSULE | Freq: Every day | ORAL | Status: DC

## 2020-12-27 NOTE — OB Triage Note (Signed)
Decreased fetal movement. Stacie Keller

## 2020-12-27 NOTE — Discharge Summary (Signed)
Stacie Keller is a 40 y.o. female. She is at [redacted]w[redacted]d gestation. Patient's last menstrual period was 05/09/2020. Estimated Date of Delivery: 02/22/21  Prenatal care site: [facility]  Chief complaint: Decreased fetal movement  HPI: Stacie Keller presents to L&D with complaints of decreased fetal movement since yesterday  Factors complicating pregnancy: AMA Preexisting type 2 diabetes Chronic HTN Obesity Hypothyroidism History of classical C-section Aniridia d/t PAX 6 gene mutation Pregnancy achieved with sperm donor  S: Resting comfortably. no CTX, no VB.no LOF,  Active fetal movement.   Maternal Medical History:  Past Medical Hx:  has a past medical history of Anxiety, Asthma, Depression, Diabetes mellitus without complication (HCC), Migraines, PCOS (polycystic ovarian syndrome), Pott's disease, and Thyroid disease.    Past Surgical Hx:  has a past surgical history that includes Appendectomy; Cesarean section; Eye surgery; and Cholecystectomy (N/A, 02/05/2015).   Allergies  Allergen Reactions   Aspartame Other (See Comments)   Sucrose Other (See Comments)   Hydrocodone Nausea And Vomiting   Latex Hives, Itching and Swelling   Oxycodone Nausea And Vomiting     Prior to Admission medications   Medication Sig Start Date End Date Taking? Authorizing Provider  aspirin EC 81 MG tablet Take 81 mg by mouth daily. Swallow whole.   Yes [provider]  Cholecalciferol (VITAMIN D3) 50 MCG (2000 UT) TABS Take 2,000 Units by mouth daily.   Yes [provider]  HUMULIN N 100 UNIT/ML injection Inject 110 Units into the skin 2 (two) times daily before a meal. Pt's dad draws it up. AM 65 PM 45 07/24/20  Yes [provider]  HUMULIN R 100 UNIT/ML injection Inject 103 Units into the skin daily before breakfast. 42 AM, 24 @ lunch, 37 PM 07/25/20  Yes [provider]  Insulin Syringe-Needle U-100 (INSULIN SYRINGE 1CC/31GX5/16") 31G X 5/16" 1 ML MISC See admin  instructions. 07/24/20 07/24/21 Yes [provider]  metoprolol succinate (TOPROL-XL) 100 MG 24 hr tablet Take 100 mg by mouth daily. 07/24/20  Yes [provider]  Prenatal Vit-Fe Fumarate-FA (PRENATAL MULTIVITAMIN) TABS tablet Take 1 tablet by mouth daily at 12 noon.   Yes [provider]  SYNTHROID 200 MCG tablet Take 175 mcg by mouth daily. 06/27/20  Yes [provider]  colestipol (COLESTID) 1 g tablet Take 1 g by mouth daily. Patient not taking: No sig reported 05/03/19   [provider]  Ipratropium-Albuterol (COMBIVENT) 20-100 MCG/ACT AERS respimat Inhale 1 puff into the lungs every 6 (six) hours as needed for wheezing or shortness of breath.  Patient not taking: Reported on 12/27/2020    [provider]  SUMAtriptan (IMITREX) 100 MG tablet Take 1 pill at onset of headache.  May repeat x1 in 2 hours if not improving. 05/01/18 01/17/20  Faythe Ghee, PA-C    Social History: She  reports that she has never smoked. She has never used smokeless tobacco. She reports that she does not drink alcohol and does not use drugs.  Family History: family history includes Asthma in her mother; Breast cancer (age of onset: 35) in her paternal grandmother; COPD in her mother; Diabetes in her father and mother; Heart disease in her mother; Hypertension in her father and mother. ,no history of gyn cancers  Review of Systems: A full review of systems was performed and negative except as noted in the HPI.    O:  Temp 98.3 F (36.8 C) (Oral)   Resp 18   Ht 5' 7.5" (  1.715 m)   Wt 115.7 kg   LMP 05/09/2020   BMI 39.35 kg/m  Results for orders placed or performed during the hospital encounter of 12/27/20 (from the past 48 hour(s))  Glucose, capillary   Collection Time: 12/27/20  3:48 PM  Result Value Ref Range   Glucose-Capillary 70 70 - 99 mg/dL      Constitutional: NAD, AAOx3  HE/ENT: extraocular movements grossly intact, moist mucous membranes CV:  RRR PULM: nl respiratory effort, CTABL Abd: gravid, non-tender, non-distended, soft  Ext: Non-tender, Nonedmeatous Psych: mood appropriate, speech normal Pelvic : deferred SVE:     Fetal Monitor: Baseline: 135 bpm Variability: moderate Accels: Present Decels: none Toco: none  Category: I   Assessment: 40 y.o. [redacted]w[redacted]d here for antenatal surveillance during pregnancy.  Principle diagnosis: decreased fetal movement There were no encounter diagnoses.   Plan: Labor: not present.  Fetal Wellbeing: Reassuring Cat 1 tracing. Reactive NST  D/c home stable, precautions reviewed, follow-up as scheduled.   ----- Chari Manning, CNM Certified Nurse Midwife [facility]

## 2020-12-27 NOTE — Progress Notes (Signed)
Discharge home. Discharge instructions given. Reviewed fetal kick count with pt. Left floor ambulatory by self. Stacie Keller

## 2021-01-10 ENCOUNTER — Observation Stay
Admission: EM | Admit: 2021-01-10 | Discharge: 2021-01-10 | Disposition: A | Discharge status: Home / Self Care | Payer: Medicaid Other | Attending: Obstetrics and Gynecology | Admitting: Obstetrics and Gynecology

## 2021-01-10 ENCOUNTER — Other Ambulatory Visit: Payer: Self-pay | Admitting: Obstetrics and Gynecology

## 2021-01-10 ENCOUNTER — Encounter: Payer: Self-pay | Admitting: Obstetrics and Gynecology

## 2021-01-10 ENCOUNTER — Other Ambulatory Visit: Payer: Self-pay

## 2021-01-10 DIAGNOSIS — E039 Hypothyroidism, unspecified: Secondary | ICD-10-CM | POA: Insufficient documentation

## 2021-01-10 DIAGNOSIS — O99283 Endocrine, nutritional and metabolic diseases complicating pregnancy, third trimester: Secondary | ICD-10-CM | POA: Insufficient documentation

## 2021-01-10 DIAGNOSIS — O24113 Pre-existing diabetes mellitus, type 2, in pregnancy, third trimester: Secondary | ICD-10-CM | POA: Diagnosis not present

## 2021-01-10 DIAGNOSIS — Z3A33 33 weeks gestation of pregnancy: Secondary | ICD-10-CM | POA: Diagnosis not present

## 2021-01-10 DIAGNOSIS — O288 Other abnormal findings on antenatal screening of mother: Principal | ICD-10-CM | POA: Diagnosis present

## 2021-01-10 DIAGNOSIS — O133 Gestational [pregnancy-induced] hypertension without significant proteinuria, third trimester: Secondary | ICD-10-CM | POA: Diagnosis not present

## 2021-01-10 HISTORY — DX: Absence of iris: Q13.1

## 2021-01-10 NOTE — Discharge Summary (Signed)
Patient ID: Stacie Keller MRN: [id number] DOB/AGE: 08/23/1980 40 y.o.  Admit date: 01/10/2021 Discharge date: 01/10/2021  Admission Diagnoses: 40yo G3P1 at [redacted]w[redacted]d was seen in the office today for [facility] with an NST. NST was equivocal, Pt sent for further NST evaluation.  Discharge Diagnoses: RNST  Factors complicating pregnancy: 1. C-section scheduled for 01/28/21 with BEB 2. AMA 3. Preexisting Type II DM  4. Polyhydramnios 5. Chronic HTN  6. Obesity in pregnancy - BMI 38 7. Hypothyroidism  8. History of C/S w/ classical incision  9. Aniridia d/t PAX 6 gene mutation  10. Pregnancy achieved with sperm donor  Prenatal Procedures: NST  Consults: None  Significant Diagnostic Studies:  No results found for this or any previous visit (from the past 168 hour(s)).  Treatments: none  Hospital Course:  This is a 40 y.o. [id number] with IUP at [redacted]w[redacted]d seen for NST.  No leaking of fluid and no bleeding.    NST: FHR baseline: 130 bpm Variability: moderate Accelerations: yes Decelerations: none Category/reactivity: reactive Amniotic Fluid Index: done in the office today - normal TOCO: quiet SVE: deferred   She was observed, fetal heart rate monitoring remained reassuring, and she had no signs/symptoms of preterm labor or other maternal-fetal concerns.   She was deemed stable for discharge to home with outpatient follow up.  Discharge Physical Exam:  BP 117/65 (BP Location: Right Arm)   Pulse 87   Temp 98.4 F (36.9 C) (Oral)   Resp 17   LMP 05/09/2020   General: NAD CV: RRR Pulm: CTABL, nl effort ABD: s/nd/nt, gravid DVT Evaluation: LE non-ttp, no evidence of DVT on exam.      Discharge Condition: Stable  Disposition:  Discharge disposition: 01-Home or Self Care       Allergies as of 01/10/2021       Reactions   Aspartame Other (See Comments)   Sucrose Other (See Comments)   Hydrocodone Nausea And Vomiting   Latex Hives, Itching, Swelling   Oxycodone Nausea And  Vomiting        Medication List     STOP taking these medications    colestipol 1 g tablet Commonly known as: COLESTID   Ipratropium-Albuterol 20-100 MCG/ACT Aers respimat Commonly known as: COMBIVENT       TAKE these medications    aspirin EC 81 MG tablet Take 81 mg by mouth daily. Swallow whole.   HumuLIN N 100 UNIT/ML injection Generic drug: insulin NPH Human Inject 110 Units into the skin 2 (two) times daily before a meal. Pt's dad draws it up. AM 65 PM 45   HumuLIN R 100 units/mL injection Generic drug: insulin regular Inject 103 Units into the skin daily before breakfast. 42 AM, 24 @ lunch, 37 PM   INSULIN SYRINGE 1CC/31GX5/16" 31G X 5/16" 1 ML Misc See admin instructions.   metoprolol succinate 100 MG 24 hr tablet Commonly known as: TOPROL-XL Take 100 mg by mouth daily.   prenatal multivitamin Tabs tablet Take 1 tablet by mouth daily at 12 noon.   Synthroid 200 MCG tablet Generic drug: levothyroxine Take 175 mcg by mouth daily.   Vitamin D3 50 MCG (2000 UT) Tabs Take 2,000 Units by mouth daily.        Follow-up Information     [facility] Follow up.   Why: keep all prenatal visit Contact information: [address] [place] [address] [phone]                Signed:  Haroldine Laws, CNM 01/10/2021 12:58 PM

## 2021-01-15 ENCOUNTER — Other Ambulatory Visit: Payer: Self-pay

## 2021-01-15 DIAGNOSIS — O09523 Supervision of elderly multigravida, third trimester: Secondary | ICD-10-CM

## 2021-01-15 DIAGNOSIS — O10919 Unspecified pre-existing hypertension complicating pregnancy, unspecified trimester: Secondary | ICD-10-CM

## 2021-01-15 DIAGNOSIS — E039 Hypothyroidism, unspecified: Secondary | ICD-10-CM

## 2021-01-15 DIAGNOSIS — O24313 Unspecified pre-existing diabetes mellitus in pregnancy, third trimester: Secondary | ICD-10-CM
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
## 2021-01-16 ENCOUNTER — Ambulatory Visit: Payer: Medicaid Other | Attending: Maternal & Fetal Medicine

## 2021-01-16 ENCOUNTER — Other Ambulatory Visit: Payer: Self-pay

## 2021-01-16 DIAGNOSIS — O09293 Supervision of pregnancy with other poor reproductive or obstetric history, third trimester: Secondary | ICD-10-CM | POA: Diagnosis not present

## 2021-01-16 DIAGNOSIS — O24113 Pre-existing diabetes mellitus, type 2, in pregnancy, third trimester: Secondary | ICD-10-CM | POA: Insufficient documentation

## 2021-01-16 DIAGNOSIS — O10013 Pre-existing essential hypertension complicating pregnancy, third trimester: Secondary | ICD-10-CM | POA: Insufficient documentation

## 2021-01-16 DIAGNOSIS — O09523 Supervision of elderly multigravida, third trimester: Secondary | ICD-10-CM | POA: Insufficient documentation

## 2021-01-16 DIAGNOSIS — O99283 Endocrine, nutritional and metabolic diseases complicating pregnancy, third trimester: Secondary | ICD-10-CM | POA: Diagnosis not present

## 2021-01-16 DIAGNOSIS — E039 Hypothyroidism, unspecified: Secondary | ICD-10-CM | POA: Insufficient documentation

## 2021-01-16 DIAGNOSIS — Z3A34 34 weeks gestation of pregnancy: Secondary | ICD-10-CM | POA: Insufficient documentation

## 2021-01-16 DIAGNOSIS — O10919 Unspecified pre-existing hypertension complicating pregnancy, unspecified trimester: Secondary | ICD-10-CM

## 2021-01-16 DIAGNOSIS — O24313 Unspecified pre-existing diabetes mellitus in pregnancy, third trimester: Secondary | ICD-10-CM

## 2021-01-17 ENCOUNTER — Encounter
Admission: RE | Admit: 2021-01-17 | Discharge: 2021-01-17 | Disposition: A | Discharge status: Home / Self Care | Payer: Medicaid Other | Source: Ambulatory Visit | Attending: Obstetrics and Gynecology | Admitting: Obstetrics and Gynecology

## 2021-01-17 HISTORY — DX: Postural orthostatic tachycardia syndrome (POTS): G90.A

## 2021-01-17 NOTE — Patient Instructions (Addendum)
Your procedure is scheduled on: 01/28/21 Tue Report to  [facility] at 6:00 am. 2 visitors may accompany you to [facility] (children under 12 are not permitted to visit) Valet parking is available free or charge if interested [facility] # [phone]  Remember: Instructions that are not followed completely may result in serious medical risk, up to and including death, or upon the discretion of your surgeon and anesthesiologist your surgery may need to be rescheduled.     _X__ 1. Do not eat food or drink any liquids after midnight the night before your procedure.                 No gum chewing or hard candies.   __X__2.  On the morning of surgery brush your teeth with toothpaste and water, you                 may rinse your mouth with mouthwash if you wish.  Do not swallow any              toothpaste of mouthwash.     _X__ 3.  No Alcohol for 24 hours before or after surgery.   _X__ 4.  Do Not Smoke or use e-cigarettes For 24 Hours Prior to Your Surgery.                 Do not use any chewable tobacco products for at least 6 hours prior to                 surgery.  ____  5.  Bring all medications with you on the day of surgery if instructed.   __X__  6.  Notify your doctor if there is any change in your medical condition      (cold, fever, infections).     Do not wear jewelry, make-up, hairpins, clips or nail polish. Do not wear lotions, powders, or perfumes.  Do not shave 48 hours prior to surgery. Men may shave face and neck. Do not bring valuables to the hospital.    [facility] is not responsible for any belongings or valuables.  Contacts, dentures/partials or body piercings may not be worn into surgery. Bring a case for your contacts, glasses or hearing aids, a denture cup will be supplied. Leave your suitcase in the car. After surgery it may be brought to your room. For patients admitted to the hospital, discharge time is determined by your treatment  team.   Patients discharged the day of surgery will not be allowed to drive home.   Please read over the following fact sheets that you were given:   CHG soap  __X__ Take these medicines the morning of surgery with A SIP OF WATER:    1. (SYNTHROID) 175 MCG tablet  2. metoprolol succinate (TOPROL-XL) 100 MG 24 hr tablet  3.   4.  5.  6.  ____ Fleet Enema (as directed)   __X__ Use CHG Soap/SAGE wipes as directed  __X__ Use inhalers on the day of surgery  ____ Stop metformin/Janumet/Farxiga 2 days prior to surgery    __X__ Take 1/2 of usual insulin dose the night before surgery. HUMULIN N DECREASE TO 22 UNITS THE EVENING OF 01/27/21   No insulin the morning of surgery.   ____ Stop Blood Thinners Coumadin/Plavix/Xarelto/Pleta/Pradaxa/Eliquis/Effient/Aspirin  on   Or contact your Surgeon, Cardiologist or Medical Doctor regarding  ability to stop your blood thinners  __X__ Stop Anti-inflammatories 7 days before surgery such as Advil, Ibuprofen,  Motrin,  BC or Goodies Powder, Naprosyn, Naproxen, Aleve   __X__ Stop all herbal supplements, fish oil or vitamin E until after surgery.    ____ Bring C-Pap to the hospital.      How to Use an Incentive Spirometer An incentive spirometer is a tool that measures how well you are filling your lungs with each breath. Learning to take long, deep breaths using this tool can help you keep your lungs clear and active. This may help to reverse or lessen your chance of developing breathing (pulmonary) problems, especially infection. You may be asked to use a spirometer: After a surgery. If you have a lung problem or a history of smoking. After a long period of time when you have been unable to move or be active. If the spirometer includes an indicator to show the highest number that you have reached, your health care provider or respiratory therapist will help you set a goal. Keep a log of your progress as told by your health care provider. What  are the risks? Breathing too quickly may cause dizziness or cause you to pass out. Take your time so you do not get dizzy or light-headed. If you are in pain, you may need to take pain medicine before doing incentive spirometry. It is harder to take a deep breath if you are having pain. How to use your incentive spirometer  Sit up on the edge of your bed or on a chair. Hold the incentive spirometer so that it is in an upright position. Before you use the spirometer, breathe out normally. Place the mouthpiece in your mouth. Make sure your lips are closed tightly around it. Breathe in slowly and as deeply as you can through your mouth, causing the piston or the ball to rise toward the top of the chamber. Hold your breath for 3-5 seconds, or for as long as possible. If the spirometer includes a coach indicator, use this to guide you in breathing. Slow down your breathing if the indicator goes above the marked areas. Remove the mouthpiece from your mouth and breathe out normally. The piston or ball will return to the bottom of the chamber. Rest for a few seconds, then repeat the steps 10 or more times. Take your time and take a few normal breaths between deep breaths so that you do not get dizzy or light-headed. Do this every 1-2 hours when you are awake. If the spirometer includes a goal marker to show the highest number you have reached (best effort), use this as a goal to work toward during each repetition. After each set of 10 deep breaths, cough a few times. This will help to make sure that your lungs are clear. If you have an incision on your chest or abdomen from surgery, place a pillow or a rolled-up towel firmly against the incision when you cough. This can help to reduce pain while taking deep breaths and coughing. General tips When you are able to get out of bed: Walk around often. Continue to take deep breaths and cough in order to clear your lungs. Keep using the incentive spirometer  until your health care provider says it is okay to stop using it. If you have been in the hospital, you may be told to keep using the spirometer at home. Contact a health care provider if: You are having difficulty using the spirometer. You have trouble using the spirometer as often as instructed. Your pain medicine is not giving enough relief for you  to use the spirometer as told. You have a fever. Get help right away if: You develop shortness of breath. You develop a cough with bloody mucus from the lungs. You have fluid or blood coming from an incision site after you cough. Summary An incentive spirometer is a tool that can help you learn to take long, deep breaths to keep your lungs clear and active. You may be asked to use a spirometer after a surgery, if you have a lung problem or a history of smoking, or if you have been inactive for a long period of time. Use your incentive spirometer as instructed every 1-2 hours while you are awake. If you have an incision on your chest or abdomen, place a pillow or a rolled-up towel firmly against your incision when you cough. This will help to reduce pain. Get help right away if you have shortness of breath, you cough up bloody mucus, or blood comes from your incision when you cough. This information is not intended to replace advice given to you by your health care provider. Make sure you discuss any questions you have with your health care provider. Document Revised: 05/08/2019 Document Reviewed: 05/08/2019 Elsevier Patient Education  2022 [employer].

## 2021-01-21 ENCOUNTER — Observation Stay
Admission: EM | Admit: 2021-01-21 | Discharge: 2021-01-21 | Disposition: A | Discharge status: Home / Self Care | Payer: Medicaid Other

## 2021-01-21 DIAGNOSIS — O99513 Diseases of the respiratory system complicating pregnancy, third trimester: Secondary | ICD-10-CM | POA: Diagnosis not present

## 2021-01-21 DIAGNOSIS — Z3A35 35 weeks gestation of pregnancy: Secondary | ICD-10-CM | POA: Insufficient documentation

## 2021-01-21 DIAGNOSIS — Z7982 Long term (current) use of aspirin: Secondary | ICD-10-CM | POA: Diagnosis not present

## 2021-01-21 DIAGNOSIS — O0993 Supervision of high risk pregnancy, unspecified, third trimester: Principal | ICD-10-CM

## 2021-01-21 DIAGNOSIS — Z91040 Latex allergy status: Secondary | ICD-10-CM | POA: Diagnosis not present

## 2021-01-21 DIAGNOSIS — Z79899 Other long term (current) drug therapy: Secondary | ICD-10-CM | POA: Diagnosis not present

## 2021-01-21 DIAGNOSIS — J45909 Unspecified asthma, uncomplicated: Secondary | ICD-10-CM | POA: Diagnosis not present

## 2021-01-21 DIAGNOSIS — O24113 Pre-existing diabetes mellitus, type 2, in pregnancy, third trimester: Secondary | ICD-10-CM | POA: Diagnosis not present

## 2021-01-21 NOTE — OB Triage Note (Addendum)
Pt. here for scheduled bi-weekly NST. External [place] and Toco applied Initial FHR 137bpm. Vital signs WNL. Will notify provider of arrival and continue to assess.  NST Reactive. Discharge Home.

## 2021-01-21 NOTE — Discharge Summary (Signed)
Stacie Keller is a 41 y.o. female. She is at [redacted]w[redacted]d gestation. Patient's last menstrual period was 05/09/2020. Estimated Date of Delivery: 02/22/21    Prenatal care site: [facility]   Chief Complaint: high risk pregnancy, need for antepartum surveillance   S: Resting comfortably. no CTX, no VB.no LOF,  Active fetal movement.    Maternal Medical History:  Past Medical Hx:  has a past medical history of Anxiety, Asthma, Congenital aniridia of both eyes, Depression, Diabetes mellitus without complication (HCC), Migraines, PCOS (polycystic ovarian syndrome), POTS (postural orthostatic tachycardia syndrome), and Thyroid disease.  Past Surgical Hx:  has a past surgical history that includes Appendectomy; Cesarean section; Eye surgery; and Cholecystectomy (N/A, 02/05/2015).   Allergies  Allergen Reactions   Aspartame Other (See Comments)    Migraines   Sucrose Other (See Comments)    Migraines   Hydrocodone Nausea And Vomiting   Latex Hives, Itching and Swelling   Oxycodone Nausea And Vomiting    Prior to Admission medications   Medication Sig Start Date End Date Taking? Authorizing Provider  aspirin EC 81 MG tablet Take 81 mg by mouth daily. Swallow whole.    [provider]  Cholecalciferol (VITAMIN D3) 50 MCG (2000 UT) TABS Take 2,000 Units by mouth daily.    [provider]  HUMULIN N 100 UNIT/ML injection Inject 45-65 Units into the skin See admin instructions. Take 65 units in the morning and 45 units in the evening. 07/24/20   [provider]  HUMULIN R 100 UNIT/ML injection Inject 24-42 Units into the skin See admin instructions. Take 42 units in the morning 24 Units at  lunch, 37 units and PM. 07/25/20   [provider]  Insulin Syringe-Needle U-100 (INSULIN SYRINGE 1CC/31GX5/16") 31G X 5/16" 1 ML MISC See admin instructions. 07/24/20 07/24/21  [provider]  Ipratropium-Albuterol (COMBIVENT RESPIMAT) 20-100 MCG/ACT AERS respimat  Inhale 1 puff into the lungs every 6 (six) hours as needed for wheezing.    [provider]  levothyroxine (SYNTHROID) 175 MCG tablet Take 175 mcg by mouth daily. 06/27/20   [provider]  metoprolol succinate (TOPROL-XL) 100 MG 24 hr tablet Take 100 mg by mouth daily. 07/24/20   [provider]  Prenatal Vit-Fe Fumarate-FA (PRENATAL MULTIVITAMIN) TABS tablet Take 1 tablet by mouth daily at 12 noon.    [provider]  SUMAtriptan (IMITREX) 100 MG tablet Take 1 pill at onset of headache.  May repeat x1 in 2 hours if not improving. 05/01/18 01/17/20  Faythe Ghee, PA-C     Social History: She  reports that she has never smoked. She has never used smokeless tobacco. She reports that she does not drink alcohol and does not use drugs.  Family History: family history includes Asthma in her mother; Breast cancer (age of onset: 58) in her paternal grandmother; COPD in her mother; Diabetes in her father and mother; Heart disease in her mother; Hypertension in her father and mother.   Review of Systems: A full review of systems was performed and negative except as noted in the HPI.     O:  BP 125/66 (BP Location: Right Arm)   Pulse 80   Temp 98.3 F (36.8 C) (Oral)   Resp 18   LMP 05/09/2020  No results found for this or any previous visit (from the past 48 hour(s)).   Constitutional: NAD, AAOx3  HE/ENT: extraocular movements grossly intact, moist mucous membranes CV: RRR PULM: nl respiratory effort, CTABL  Abd: gravid, non-tender, non-distended, soft      Ext: Non-tender, Nonedmeatous   Psych: mood appropriate, speech normal Pelvic: deferred   NST: Baseline: 135bpm Variability: moderate Accelerations present x >2 Decelerations absent Time   A/P: 40 y.o. [redacted]w[redacted]d with high risk pregnancy and antepartum surveillance.  Labor: not present.  Fetal Wellbeing: Reassuring Cat 1 tracing. Reactive NST  D/c home stable, precautions reviewed,  follow-up as scheduled.   ----- Margaretmary Eddy, CNM Certified Nurse Midwife [facility]

## 2021-01-24 ENCOUNTER — Other Ambulatory Visit: Payer: Self-pay

## 2021-01-24 ENCOUNTER — Encounter: Payer: Self-pay | Admitting: Obstetrics and Gynecology

## 2021-01-24 ENCOUNTER — Observation Stay
Admission: EM | Admit: 2021-01-24 | Discharge: 2021-01-24 | Disposition: A | Discharge status: Home / Self Care | Payer: Medicaid Other | Attending: Obstetrics and Gynecology | Admitting: Obstetrics and Gynecology

## 2021-01-24 ENCOUNTER — Observation Stay: Payer: Medicaid Other

## 2021-01-24 DIAGNOSIS — O133 Gestational [pregnancy-induced] hypertension without significant proteinuria, third trimester: Secondary | ICD-10-CM | POA: Diagnosis not present

## 2021-01-24 DIAGNOSIS — E669 Obesity, unspecified: Secondary | ICD-10-CM | POA: Insufficient documentation

## 2021-01-24 DIAGNOSIS — Z6839 Body mass index (BMI) 39.0-39.9, adult: Secondary | ICD-10-CM | POA: Diagnosis not present

## 2021-01-24 DIAGNOSIS — O24113 Pre-existing diabetes mellitus, type 2, in pregnancy, third trimester: Secondary | ICD-10-CM | POA: Diagnosis present

## 2021-01-24 DIAGNOSIS — O09523 Supervision of elderly multigravida, third trimester: Secondary | ICD-10-CM | POA: Insufficient documentation

## 2021-01-24 DIAGNOSIS — O99213 Obesity complicating pregnancy, third trimester: Secondary | ICD-10-CM | POA: Diagnosis not present

## 2021-01-24 DIAGNOSIS — O24919 Unspecified diabetes mellitus in pregnancy, unspecified trimester: Principal | ICD-10-CM

## 2021-01-24 DIAGNOSIS — Z794 Long term (current) use of insulin: Secondary | ICD-10-CM | POA: Diagnosis not present

## 2021-01-24 DIAGNOSIS — E079 Disorder of thyroid, unspecified: Secondary | ICD-10-CM | POA: Insufficient documentation

## 2021-01-24 DIAGNOSIS — Z3A35 35 weeks gestation of pregnancy: Secondary | ICD-10-CM | POA: Diagnosis not present

## 2021-01-24 LAB — COMPREHENSIVE METABOLIC PANEL
ALT: 9 U/L (ref 0–44)
AST: 13 U/L — ABNORMAL LOW (ref 15–41)
Albumin: 3.7 g/dL (ref 3.5–5.0)
Alkaline Phosphatase: 90 U/L (ref 38–126)
Anion gap: 8 (ref 5–15)
BUN: 7 mg/dL (ref 6–20)
CO2: 25 mmol/L (ref 22–32)
Calcium: 9.5 mg/dL (ref 8.9–10.3)
Chloride: 104 mmol/L (ref 98–111)
Creatinine, Ser: 0.71 mg/dL (ref 0.44–1.00)
GFR, Estimated: >60 mL/min (ref >60)
Glucose, Bld: 75 mg/dL (ref 70–99)
Potassium: 3.7 mmol/L (ref 3.5–5.1)
Sodium: 137 mmol/L (ref 135–145)
Total Bilirubin: 0.5 mg/dL (ref 0.3–1.2)
Total Protein: 7.7 g/dL (ref 6.5–8.1)

## 2021-01-24 LAB — RAPID HIV SCREEN (HIV 1/2 AB+AG)
HIV 1/2 Antibodies: NONREACTIVE
HIV-1 P24 Antigen - HIV24: NONREACTIVE

## 2021-01-24 LAB — GLUCOSE, CAPILLARY: Glucose-Capillary: 69 mg/dL — ABNORMAL LOW (ref 70–99)

## 2021-01-24 LAB — CBC
HCT: 37.3 % (ref 36.0–46.0)
Hemoglobin: 12.6 g/dL (ref 12.0–15.0)
MCH: 26.3 pg (ref 26.0–34.0)
MCHC: 33.8 g/dL (ref 30.0–36.0)
MCV: 77.9 fL — ABNORMAL LOW (ref 80.0–100.0)
Platelets: 282 10*3/uL (ref 150–400)
RBC: 4.79 MIL/uL (ref 3.87–5.11)
RDW: 16.4 % — ABNORMAL HIGH (ref 11.5–15.5)
WBC: 11.4 10*3/uL — ABNORMAL HIGH (ref 4.0–10.5)
nRBC: 0 % (ref 0.0–0.2)

## 2021-01-24 LAB — PROTEIN / CREATININE RATIO, URINE
Creatinine, Urine: 211 mg/dL
Protein Creatinine Ratio: 0.07 mg/mg{Cre} (ref 0.00–0.15)
Total Protein, Urine: 14 mg/dL

## 2021-01-24 NOTE — OB Triage Note (Signed)
Pt discharged home in stable condition per CNM. Plan of care discussed with patient. Pt verbalized understanding of plan and reported no questions or concerns at this time.  Labs pending at discharge- approved by CNM.

## 2021-01-24 NOTE — Discharge Summary (Signed)
Stacie Keller is a 40 y.o. female. She is at [redacted]w[redacted]d gestation. Patient's last menstrual period was 05/09/2020. 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 no CTX, no VB.no LOF,  Active fetal movement.    Maternal Medical History:  Past Medical Hx:  has a past medical history of Anxiety, Asthma, Congenital aniridia of both eyes, Depression, Diabetes mellitus without complication (HCC), Migraines, PCOS (polycystic ovarian syndrome), POTS (postural orthostatic tachycardia syndrome), and Thyroid disease.  Past Surgical Hx:  has a past surgical history that includes Appendectomy; Cesarean section; Eye surgery; and Cholecystectomy (N/A, 02/05/2015).   Allergies  Allergen Reactions   Aspartame Other (See Comments)    Migraines   Sucrose Other (See Comments)    Migraines   Hydrocodone Nausea And Vomiting   Latex Hives, Itching and Swelling   Oxycodone Nausea And Vomiting    Prior to Admission medications   Medication Sig Start Date End Date Taking? Authorizing Provider  aspirin EC 81 MG tablet Take 81 mg by mouth daily. Swallow whole.    [provider]  Cholecalciferol (VITAMIN D3) 50 MCG (2000 UT) TABS Take 2,000 Units by mouth daily.    [provider]  HUMULIN N 100 UNIT/ML injection Inject 45-65 Units into the skin See admin instructions. Take 65 units in the morning and 45 units in the evening. 07/24/20   [provider]  HUMULIN R 100 UNIT/ML injection Inject 24-42 Units into the skin See admin instructions. Take 42 units in the morning 24 Units at  lunch, 37 units and PM. 07/25/20   [provider]  Insulin Syringe-Needle U-100 (INSULIN SYRINGE 1CC/31GX5/16") 31G X 5/16" 1 ML MISC See admin  instructions. 07/24/20 07/24/21  [provider]  Ipratropium-Albuterol (COMBIVENT RESPIMAT) 20-100 MCG/ACT AERS respimat Inhale 1 puff into the lungs every 6 (six) hours as needed for wheezing.    [provider]  levothyroxine (SYNTHROID) 175 MCG tablet Take 175 mcg by mouth daily. 06/27/20   [provider]  metoprolol succinate (TOPROL-XL) 100 MG 24 hr tablet Take 100 mg by mouth daily. 07/24/20   [provider]  Prenatal Vit-Fe Fumarate-FA (PRENATAL MULTIVITAMIN) TABS tablet Take 1 tablet by mouth daily at 12 noon.    [provider]  SUMAtriptan (IMITREX) 100 MG tablet Take 1 pill at onset of headache.  May repeat x1 in 2 hours if not improving. 05/01/18 01/17/20  Faythe Ghee, PA-C     Social History: She  reports that she has never smoked. She has never used smokeless tobacco. She reports that she does not drink alcohol and does not use drugs.  Family History: family history includes Asthma in her mother; Breast cancer (age of onset: 4) in her paternal grandmother; COPD in her mother; Diabetes in her father and mother; Heart disease in her mother; Hypertension in her father and mother.   Review of Systems: A full review of systems was performed and negative except as noted in the HPI.     O:  Temp 98.2 F (36.8 C) (Oral)   Resp 18   Ht 5\' 8"  (1.727 m)   Wt 118 kg   LMP 05/09/2020   BMI 39.55 kg/m  Results for orders placed or performed during the hospital encounter of 01/24/21 (  from the past 48 hour(s))  Protein / creatinine ratio, urine   Collection Time: 01/24/21  3:27 PM  Result Value Ref Range   Creatinine, Urine 211 mg/dL   Total Protein, Urine 14 mg/dL   Protein Creatinine Ratio 0.07 0.00 - 0.15 mg/mg[Cre]  Glucose, capillary   Collection Time: 01/24/21  3:30 PM  Result Value Ref Range   Glucose-Capillary 69 (L) 70 - 99 mg/dL     Constitutional: NAD, AAOx3  HE/ENT: extraocular movements grossly intact, moist mucous  membranes CV: RRR PULM: nl respiratory effort, CTABL     Abd: gravid, non-tender, non-distended, soft      Ext: Non-tender, Nonedmeatous   Psych: mood appropriate, speech normal Pelvic: deferred   NST: Baseline: 130 bpm Variability:  moderate Accelerations present x >2 Decelerations absent Time   A/P: 40 y.o. [redacted]w[redacted]d with high risk pregnancy and antepartum surveillance.  BPP 8/8, AFI 13.6cm Fetal Wellbeing: Reassuring Cat 1 tracing. Reactive NST  Labs ordered for routine 36wk labs today Scheduled CS 01/28/21 due to prior Classical.  D/c home stable, precautions reviewed, follow-up as scheduled.    Randa Ngo, CNM  Certified Nurse Midwife [facility]

## 2021-01-24 NOTE — OB Triage Note (Signed)
Pt presented to L/D triage for NST, labs, and ultrasound. Pt reports no bleeding or LOF and positive fetal movement. Monitors applied and assessing. VSS.  Pt reports feeling hypoglycemic- CBG 69. Snack given.

## 2021-01-25 LAB — RPR: RPR Ser Ql: NONREACTIVE

## 2021-01-27 ENCOUNTER — Encounter: Payer: Self-pay | Admitting: Urgent Care

## 2021-01-27 ENCOUNTER — Other Ambulatory Visit: Payer: Medicaid Other

## 2021-01-27 ENCOUNTER — Other Ambulatory Visit: Payer: Self-pay

## 2021-01-27 ENCOUNTER — Encounter
Admission: RE | Admit: 2021-01-27 | Discharge: 2021-01-27 | Disposition: A | Discharge status: Home / Self Care | Payer: Medicaid Other | Source: Ambulatory Visit | Attending: Obstetrics and Gynecology | Admitting: Obstetrics and Gynecology

## 2021-01-27 DIAGNOSIS — Z3A00 Weeks of gestation of pregnancy not specified: Secondary | ICD-10-CM | POA: Insufficient documentation

## 2021-01-27 DIAGNOSIS — O09523 Supervision of elderly multigravida, third trimester: Secondary | ICD-10-CM

## 2021-01-27 DIAGNOSIS — Z20822 Contact with and (suspected) exposure to covid-19: Secondary | ICD-10-CM | POA: Insufficient documentation

## 2021-01-27 DIAGNOSIS — Z01812 Encounter for preprocedural laboratory examination: Secondary | ICD-10-CM | POA: Insufficient documentation

## 2021-01-27 LAB — CBC
HCT: 35.4 % — ABNORMAL LOW (ref 36.0–46.0)
Hemoglobin: 11.6 g/dL — ABNORMAL LOW (ref 12.0–15.0)
MCH: 25.5 pg — ABNORMAL LOW (ref 26.0–34.0)
MCHC: 32.8 g/dL (ref 30.0–36.0)
MCV: 77.8 fL — ABNORMAL LOW (ref 80.0–100.0)
Platelets: 246 10*3/uL (ref 150–400)
RBC: 4.55 MIL/uL (ref 3.87–5.11)
RDW: 16.5 % — ABNORMAL HIGH (ref 11.5–15.5)
WBC: 11.3 10*3/uL — ABNORMAL HIGH (ref 4.0–10.5)
nRBC: 0 % (ref 0.0–0.2)

## 2021-01-27 LAB — BASIC METABOLIC PANEL
Anion gap: 5 (ref 5–15)
BUN: 7 mg/dL (ref 6–20)
CO2: 26 mmol/L (ref 22–32)
Calcium: 9.1 mg/dL (ref 8.9–10.3)
Chloride: 106 mmol/L (ref 98–111)
Creatinine, Ser: 0.8 mg/dL (ref 0.44–1.00)
GFR, Estimated: >60 mL/min (ref >60)
Glucose, Bld: 103 mg/dL — ABNORMAL HIGH (ref 70–99)
Potassium: 4 mmol/L (ref 3.5–5.1)
Sodium: 137 mmol/L (ref 135–145)

## 2021-01-28 ENCOUNTER — Inpatient Hospital Stay: Payer: Medicaid Other | Admitting: Urgent Care

## 2021-01-28 ENCOUNTER — Inpatient Hospital Stay
Admission: RE | Admit: 2021-01-28 | Discharge: 2021-01-30 | DRG: 786 | Disposition: A | Discharge status: Home / Self Care | Payer: Medicaid Other

## 2021-01-28 ENCOUNTER — Encounter: Admission: RE | Disposition: A | Discharge status: Home / Self Care | Payer: Self-pay | Source: Home / Self Care

## 2021-01-28 ENCOUNTER — Encounter: Payer: Self-pay | Admitting: Obstetrics and Gynecology

## 2021-01-28 ENCOUNTER — Other Ambulatory Visit: Payer: Self-pay

## 2021-01-28 ENCOUNTER — Inpatient Hospital Stay: Payer: Medicaid Other | Admitting: Anesthesiology

## 2021-01-28 DIAGNOSIS — Z8616 Personal history of COVID-19: Secondary | ICD-10-CM

## 2021-01-28 DIAGNOSIS — Z20822 Contact with and (suspected) exposure to covid-19: Secondary | ICD-10-CM | POA: Diagnosis present

## 2021-01-28 DIAGNOSIS — O2412 Pre-existing diabetes mellitus, type 2, in childbirth: Secondary | ICD-10-CM | POA: Diagnosis present

## 2021-01-28 DIAGNOSIS — O9081 Anemia of the puerperium: Secondary | ICD-10-CM | POA: Diagnosis not present

## 2021-01-28 DIAGNOSIS — Z7982 Long term (current) use of aspirin: Secondary | ICD-10-CM

## 2021-01-28 DIAGNOSIS — Z794 Long term (current) use of insulin: Secondary | ICD-10-CM

## 2021-01-28 DIAGNOSIS — O1002 Pre-existing essential hypertension complicating childbirth: Secondary | ICD-10-CM | POA: Diagnosis present

## 2021-01-28 DIAGNOSIS — E119 Type 2 diabetes mellitus without complications: Secondary | ICD-10-CM | POA: Diagnosis present

## 2021-01-28 DIAGNOSIS — D62 Acute posthemorrhagic anemia: Secondary | ICD-10-CM | POA: Diagnosis not present

## 2021-01-28 DIAGNOSIS — O99892 Other specified diseases and conditions complicating childbirth: Secondary | ICD-10-CM | POA: Diagnosis present

## 2021-01-28 DIAGNOSIS — O99284 Endocrine, nutritional and metabolic diseases complicating childbirth: Secondary | ICD-10-CM | POA: Diagnosis present

## 2021-01-28 DIAGNOSIS — O0993 Supervision of high risk pregnancy, unspecified, third trimester: Secondary | ICD-10-CM

## 2021-01-28 DIAGNOSIS — H548 Legal blindness, as defined in USA: Secondary | ICD-10-CM | POA: Diagnosis present

## 2021-01-28 DIAGNOSIS — O34212 Maternal care for vertical scar from previous cesarean delivery: Secondary | ICD-10-CM | POA: Diagnosis present

## 2021-01-28 DIAGNOSIS — O403XX0 Polyhydramnios, third trimester, not applicable or unspecified: Secondary | ICD-10-CM | POA: Diagnosis present

## 2021-01-28 DIAGNOSIS — O99214 Obesity complicating childbirth: Secondary | ICD-10-CM | POA: Diagnosis present

## 2021-01-28 DIAGNOSIS — Z23 Encounter for immunization: Secondary | ICD-10-CM | POA: Diagnosis not present

## 2021-01-28 DIAGNOSIS — O09523 Supervision of elderly multigravida, third trimester: Secondary | ICD-10-CM

## 2021-01-28 DIAGNOSIS — O34219 Maternal care for unspecified type scar from previous cesarean delivery: Secondary | ICD-10-CM | POA: Diagnosis present

## 2021-01-28 DIAGNOSIS — E039 Hypothyroidism, unspecified: Secondary | ICD-10-CM | POA: Diagnosis present

## 2021-01-28 DIAGNOSIS — Z3A36 36 weeks gestation of pregnancy: Secondary | ICD-10-CM | POA: Diagnosis not present

## 2021-01-28 LAB — CBC
HCT: 30.9 % — ABNORMAL LOW (ref 36.0–46.0)
Hemoglobin: 10.4 g/dL — ABNORMAL LOW (ref 12.0–15.0)
MCH: 26.3 pg (ref 26.0–34.0)
MCHC: 33.7 g/dL (ref 30.0–36.0)
MCV: 78.2 fL — ABNORMAL LOW (ref 80.0–100.0)
Platelets: 237 10*3/uL (ref 150–400)
RBC: 3.95 MIL/uL (ref 3.87–5.11)
RDW: 16.4 % — ABNORMAL HIGH (ref 11.5–15.5)
WBC: 17.6 10*3/uL — ABNORMAL HIGH (ref 4.0–10.5)
nRBC: 0 % (ref 0.0–0.2)

## 2021-01-28 LAB — TYPE AND SCREEN
ABO/RH(D): A POS
Antibody Screen: NEGATIVE
Extend sample reason: UNDETERMINED
Unit division: 0
Unit division: 0

## 2021-01-28 LAB — GLUCOSE, CAPILLARY
Glucose-Capillary: 130 mg/dL — ABNORMAL HIGH (ref 70–99)
Glucose-Capillary: 127 mg/dL — ABNORMAL HIGH (ref 70–99)

## 2021-01-28 LAB — BPAM RBC
Blood Product Expiration Date: 202212212359
Blood Product Expiration Date: 202212242359
ISSUE DATE / TIME: 202211290931
ISSUE DATE / TIME: 202211290931
Unit Type and Rh: 6200
Unit Type and Rh: 6200

## 2021-01-28 LAB — SARS CORONAVIRUS 2 (TAT 6-24 HRS): SARS Coronavirus 2: NEGATIVE

## 2021-01-28 LAB — CREATININE, SERUM
Creatinine, Ser: 0.62 mg/dL (ref 0.44–1.00)
GFR, Estimated: >60 mL/min

## 2021-01-28 LAB — PREPARE RBC (CROSSMATCH)

## 2021-01-28 LAB — RPR: RPR Ser Ql: NONREACTIVE

## 2021-01-28 LAB — ABO/RH: ABO/RH(D): A POS

## 2021-01-28 SURGERY — Surgical Case
Anesthesia: Spinal

## 2021-01-28 MED ORDER — SIMETHICONE 80 MG PO CHEW
80.0000 mg | CHEWABLE_TABLET | Freq: Three times a day (TID) | ORAL | Status: DC
  Filled 2021-01-28: qty 1

## 2021-01-28 MED ORDER — WITCH HAZEL-GLYCERIN EX PADS: 1.0000 "application " | MEDICATED_PAD | CUTANEOUS | Status: DC | PRN

## 2021-01-28 MED ORDER — IBUPROFEN 600 MG PO TABS
600.0000 mg | ORAL_TABLET | Freq: Four times a day (QID) | ORAL | Status: DC
  Administered 2021-01-29 – 2021-01-30 (×3): 600 mg via ORAL
  Filled 2021-01-28 (×3): qty 1

## 2021-01-28 MED ORDER — LACTATED RINGERS IV SOLN: Freq: Once | INTRAVENOUS | Status: AC

## 2021-01-28 MED ORDER — BUPIVACAINE LIPOSOME 1.3 % IJ SUSP: 20.0000 mL | Freq: Once | INTRAMUSCULAR | Status: DC

## 2021-01-28 MED ORDER — SODIUM CHLORIDE 0.9% IV SOLUTION: Freq: Once | INTRAVENOUS | Status: DC

## 2021-01-28 MED ORDER — NALBUPHINE HCL 10 MG/ML IJ SOLN
5.0000 mg | Freq: Once | INTRAMUSCULAR | Status: DC | PRN
  Filled 2021-01-28: qty 1
  Filled 2021-01-28: qty 0.5

## 2021-01-28 MED ORDER — TETANUS-DIPHTH-ACELL PERTUSSIS 5-2.5-18.5 LF-MCG/0.5 IM SUSY
0.5000 mL | PREFILLED_SYRINGE | Freq: Once | INTRAMUSCULAR | Status: AC
  Administered 2021-01-30: 0.5 mL via INTRAMUSCULAR
  Filled 2021-01-28 (×2): qty 0.5

## 2021-01-28 MED ORDER — LACTATED RINGERS IV SOLN: INTRAVENOUS | Status: DC | PRN

## 2021-01-28 MED ORDER — MEASLES, MUMPS & RUBELLA VAC IJ SOLR
0.5000 mL | Freq: Once | INTRAMUSCULAR | Status: DC
  Filled 2021-01-28: qty 0.5

## 2021-01-28 MED ORDER — PHENYLEPHRINE HCL-NACL 20-0.9 MG/250ML-% IV SOLN
INTRAVENOUS | Status: AC
  Filled 2021-01-28: qty 250

## 2021-01-28 MED ORDER — SOD CITRATE-CITRIC ACID 500-334 MG/5ML PO SOLN
30.0000 mL | ORAL | Status: DC
  Administered 2021-01-28: 30 mL via ORAL
  Filled 2021-01-28: qty 30

## 2021-01-28 MED ORDER — DIBUCAINE (PERIANAL) 1 % EX OINT: 1.0000 "application " | TOPICAL_OINTMENT | CUTANEOUS | Status: DC | PRN

## 2021-01-28 MED ORDER — MORPHINE SULFATE (PF) 0.5 MG/ML IJ SOLN
INTRAMUSCULAR | Status: DC | PRN
  Administered 2021-01-28: .1 mg via INTRATHECAL

## 2021-01-28 MED ORDER — PHENYLEPHRINE HCL (PRESSORS) 10 MG/ML IV SOLN
INTRAVENOUS | Status: DC | PRN
  Administered 2021-01-28: 80 ug via INTRAVENOUS
  Administered 2021-01-28: 160 ug via INTRAVENOUS

## 2021-01-28 MED ORDER — NALBUPHINE HCL 10 MG/ML IJ SOLN
5.0000 mg | INTRAMUSCULAR | Status: DC | PRN
  Filled 2021-01-28: qty 0.5

## 2021-01-28 MED ORDER — PRENATAL MULTIVITAMIN CH
1.0000 | ORAL_TABLET | Freq: Every day | ORAL | Status: DC
  Administered 2021-01-28 – 2021-01-30 (×3): 1 via ORAL
  Filled 2021-01-28 (×2): qty 1

## 2021-01-28 MED ORDER — ACETAMINOPHEN 500 MG PO TABS: 1000.0000 mg | ORAL_TABLET | Freq: Four times a day (QID) | ORAL | Status: DC

## 2021-01-28 MED ORDER — FENTANYL CITRATE (PF) 100 MCG/2ML IJ SOLN
INTRAMUSCULAR | Status: DC | PRN
  Administered 2021-01-28: 15 ug via INTRATHECAL

## 2021-01-28 MED ORDER — KETOROLAC TROMETHAMINE 30 MG/ML IJ SOLN: 30.0000 mg | Freq: Four times a day (QID) | INTRAMUSCULAR | Status: AC | PRN

## 2021-01-28 MED ORDER — BUPIVACAINE HCL (PF) 0.5 % IJ SOLN: 30.0000 mL | Freq: Once | INTRAMUSCULAR | Status: DC

## 2021-01-28 MED ORDER — NALBUPHINE HCL 10 MG/ML IJ SOLN
5.0000 mg | Freq: Once | INTRAMUSCULAR | Status: DC | PRN
  Filled 2021-01-28: qty 0.5

## 2021-01-28 MED ORDER — TRANEXAMIC ACID-NACL 1000-0.7 MG/100ML-% IV SOLN
INTRAVENOUS | Status: DC | PRN
  Administered 2021-01-28: 1000 mg via INTRAVENOUS

## 2021-01-28 MED ORDER — MENTHOL 3 MG MT LOZG
1.0000 | LOZENGE | OROMUCOSAL | Status: DC | PRN
  Filled 2021-01-28: qty 9

## 2021-01-28 MED ORDER — ONDANSETRON HCL 4 MG/2ML IJ SOLN
INTRAMUSCULAR | Status: DC | PRN
  Administered 2021-01-28 (×2): 4 mg via INTRAVENOUS

## 2021-01-28 MED ORDER — SODIUM CHLORIDE 0.9 % IV SOLN
INTRAVENOUS | Status: DC | PRN
  Administered 2021-01-28: 40 mL

## 2021-01-28 MED ORDER — NALOXONE HCL 0.4 MG/ML IJ SOLN
0.4000 mg | INTRAMUSCULAR | Status: DC | PRN
  Filled 2021-01-28: qty 1

## 2021-01-28 MED ORDER — BUPIVACAINE IN DEXTROSE 0.75-8.25 % IT SOLN
INTRATHECAL | Status: DC | PRN
  Administered 2021-01-28: 1.6 mL via INTRATHECAL

## 2021-01-28 MED ORDER — CEFAZOLIN SODIUM-DEXTROSE 2-4 GM/100ML-% IV SOLN: 2.0000 g | INTRAVENOUS | Status: DC

## 2021-01-28 MED ORDER — DEXMEDETOMIDINE (PRECEDEX) IN NS 20 MCG/5ML (4 MCG/ML) IV SYRINGE
PREFILLED_SYRINGE | INTRAVENOUS | Status: DC | PRN
  Administered 2021-01-28: 8 ug via INTRAVENOUS

## 2021-01-28 MED ORDER — ONDANSETRON HCL 4 MG/2ML IJ SOLN: 4.0000 mg | Freq: Three times a day (TID) | INTRAMUSCULAR | Status: DC | PRN

## 2021-01-28 MED ORDER — COCONUT OIL OIL
1.0000 "application " | TOPICAL_OIL | Status: DC | PRN
  Filled 2021-01-28: qty 120

## 2021-01-28 MED ORDER — SODIUM CHLORIDE 0.9% FLUSH
50.0000 mL | Freq: Once | INTRAVENOUS | Status: DC
  Filled 2021-01-28: qty 51

## 2021-01-28 MED ORDER — FLEET ENEMA 7-19 GM/118ML RE ENEM: 1.0000 | ENEMA | Freq: Every day | RECTAL | Status: DC | PRN

## 2021-01-28 MED ORDER — INSULIN ASPART 100 UNIT/ML IJ SOLN
INTRAMUSCULAR | Status: AC
  Filled 2021-01-28: qty 1

## 2021-01-28 MED ORDER — METOPROLOL SUCCINATE ER 100 MG PO TB24
100.0000 mg | ORAL_TABLET | Freq: Every day | ORAL | Status: DC
  Administered 2021-01-29: 100 mg via ORAL
  Filled 2021-01-28: qty 4
  Filled 2021-01-28 (×2): qty 1

## 2021-01-28 MED ORDER — CEFAZOLIN IN SODIUM CHLORIDE 3-0.9 GM/100ML-% IV SOLN
3.0000 g | Freq: Once | INTRAVENOUS | Status: AC
  Administered 2021-01-28: 3 g via INTRAVENOUS
  Filled 2021-01-28: qty 100

## 2021-01-28 MED ORDER — NALBUPHINE HCL 10 MG/ML IJ SOLN
5.0000 mg | INTRAMUSCULAR | Status: DC | PRN
  Administered 2021-01-28 – 2021-01-29 (×3): 5 mg via INTRAVENOUS
  Filled 2021-01-28: qty 0.5

## 2021-01-28 MED ORDER — LEVOTHYROXINE SODIUM 175 MCG PO TABS
175.0000 ug | ORAL_TABLET | Freq: Every day | ORAL | Status: DC
  Administered 2021-01-29 – 2021-01-30 (×2): 175 ug via ORAL
  Filled 2021-01-28 (×2): qty 1

## 2021-01-28 MED ORDER — MORPHINE SULFATE (PF) 0.5 MG/ML IJ SOLN
INTRAMUSCULAR | Status: AC
  Filled 2021-01-28: qty 10

## 2021-01-28 MED ORDER — POVIDONE-IODINE 10 % EX SWAB: 2.0000 "application " | Freq: Once | CUTANEOUS | Status: DC

## 2021-01-28 MED ORDER — BISACODYL 10 MG RE SUPP: 10.0000 mg | Freq: Every day | RECTAL | Status: DC | PRN

## 2021-01-28 MED ORDER — LACTATED RINGERS IV SOLN: INTRAVENOUS | Status: DC

## 2021-01-28 MED ORDER — EPHEDRINE 5 MG/ML INJ
INTRAVENOUS | Status: AC
  Filled 2021-01-28: qty 5

## 2021-01-28 MED ORDER — INSULIN ASPART 100 UNIT/ML IJ SOLN: 0.0000 [IU] | Freq: Three times a day (TID) | INTRAMUSCULAR | Status: DC

## 2021-01-28 MED ORDER — BUPIVACAINE HCL (PF) 0.25 % IJ SOLN
INTRAMUSCULAR | Status: DC | PRN
  Administered 2021-01-28: 30 mL

## 2021-01-28 MED ORDER — FENTANYL CITRATE (PF) 100 MCG/2ML IJ SOLN
INTRAMUSCULAR | Status: AC
  Filled 2021-01-28: qty 2

## 2021-01-28 MED ORDER — HYDROMORPHONE HCL 2 MG PO TABS
2.0000 mg | ORAL_TABLET | ORAL | Status: DC | PRN
  Administered 2021-01-29 – 2021-01-30 (×3): 2 mg via ORAL
  Filled 2021-01-28 (×4): qty 1

## 2021-01-28 MED ORDER — ENOXAPARIN SODIUM 40 MG/0.4ML IJ SOSY
40.0000 mg | PREFILLED_SYRINGE | INTRAMUSCULAR | Status: DC
  Administered 2021-01-29: 40 mg via SUBCUTANEOUS
  Filled 2021-01-28 (×3): qty 0.4

## 2021-01-28 MED ORDER — DEXAMETHASONE SODIUM PHOSPHATE 10 MG/ML IJ SOLN
INTRAMUSCULAR | Status: DC | PRN
  Administered 2021-01-28: 10 mg via INTRAVENOUS

## 2021-01-28 MED ORDER — DEXMEDETOMIDINE HCL IN NACL 200 MCG/50ML IV SOLN
INTRAVENOUS | Status: AC
  Filled 2021-01-28: qty 50

## 2021-01-28 MED ORDER — GABAPENTIN 300 MG PO CAPS
300.0000 mg | ORAL_CAPSULE | Freq: Every day | ORAL | Status: DC
  Administered 2021-01-28 – 2021-01-29 (×2): 300 mg via ORAL
  Filled 2021-01-28 (×3): qty 1

## 2021-01-28 MED ORDER — KETOROLAC TROMETHAMINE 30 MG/ML IJ SOLN
30.0000 mg | Freq: Four times a day (QID) | INTRAMUSCULAR | Status: AC
  Administered 2021-01-28 – 2021-01-29 (×4): 30 mg via INTRAVENOUS
  Filled 2021-01-28 (×2): qty 1

## 2021-01-28 MED ORDER — DIPHENHYDRAMINE HCL 50 MG/ML IJ SOLN: 12.5000 mg | INTRAMUSCULAR | Status: DC | PRN

## 2021-01-28 MED ORDER — NALOXONE HCL 4 MG/10ML IJ SOLN
1.0000 ug/kg/h | INTRAVENOUS | Status: DC | PRN
  Filled 2021-01-28: qty 5

## 2021-01-28 MED ORDER — 0.9 % SODIUM CHLORIDE (POUR BTL) OPTIME
TOPICAL | Status: DC | PRN
  Administered 2021-01-28: 1000 mL

## 2021-01-28 MED ORDER — OXYTOCIN-SODIUM CHLORIDE 30-0.9 UT/500ML-% IV SOLN
2.5000 [IU]/h | INTRAVENOUS | Status: AC
  Administered 2021-01-28 (×2): 2.5 [IU]/h via INTRAVENOUS

## 2021-01-28 MED ORDER — ACETAMINOPHEN 500 MG PO TABS
1000.0000 mg | ORAL_TABLET | Freq: Four times a day (QID) | ORAL | Status: DC
  Administered 2021-01-28 – 2021-01-30 (×7): 1000 mg via ORAL
  Filled 2021-01-28 (×7): qty 2

## 2021-01-28 MED ORDER — INSULIN ASPART 100 UNIT/ML IJ SOLN
0.0000 [IU] | Freq: Two times a day (BID) | INTRAMUSCULAR | Status: DC
  Administered 2021-01-28: 2 [IU] via SUBCUTANEOUS

## 2021-01-28 MED ORDER — OXYTOCIN-SODIUM CHLORIDE 30-0.9 UT/500ML-% IV SOLN
INTRAVENOUS | Status: DC | PRN
  Administered 2021-01-28: 700 mL via INTRAVENOUS
  Administered 2021-01-28: 250 mL/h via INTRAVENOUS

## 2021-01-28 MED ORDER — SENNOSIDES-DOCUSATE SODIUM 8.6-50 MG PO TABS
2.0000 | ORAL_TABLET | ORAL | Status: DC
  Administered 2021-01-28 – 2021-01-29 (×2): 2 via ORAL
  Filled 2021-01-28 (×2): qty 2

## 2021-01-28 MED ORDER — SODIUM CHLORIDE 0.9% FLUSH: 3.0000 mL | INTRAVENOUS | Status: DC | PRN

## 2021-01-28 MED ORDER — FERROUS SULFATE 325 (65 FE) MG PO TABS
325.0000 mg | ORAL_TABLET | Freq: Two times a day (BID) | ORAL | Status: DC
  Administered 2021-01-29 – 2021-01-30 (×3): 325 mg via ORAL
  Filled 2021-01-28 (×3): qty 1

## 2021-01-28 MED ORDER — FENTANYL CITRATE (PF) 100 MCG/2ML IJ SOLN
INTRAMUSCULAR | Status: DC | PRN
  Administered 2021-01-28: 25 ug via INTRAVENOUS
  Administered 2021-01-28: 35 ug via INTRAVENOUS
  Administered 2021-01-28: 25 ug via INTRAVENOUS

## 2021-01-28 MED ORDER — EPHEDRINE SULFATE-NACL 50-0.9 MG/10ML-% IV SOSY
PREFILLED_SYRINGE | INTRAVENOUS | Status: DC | PRN
  Administered 2021-01-28: 5 mg via INTRAVENOUS

## 2021-01-28 MED ORDER — DIPHENHYDRAMINE HCL 25 MG PO CAPS: 25.0000 mg | ORAL_CAPSULE | ORAL | Status: DC | PRN

## 2021-01-28 MED ORDER — PHENYLEPHRINE HCL-NACL 20-0.9 MG/250ML-% IV SOLN
INTRAVENOUS | Status: DC | PRN
  Administered 2021-01-28: 50 ug/min via INTRAVENOUS

## 2021-01-28 MED ORDER — LIDOCAINE HCL (PF) 1 % IJ SOLN
INTRAMUSCULAR | Status: DC | PRN
  Administered 2021-01-28: 2 mL via SUBCUTANEOUS

## 2021-01-28 MED ORDER — SIMETHICONE 80 MG PO CHEW: 80.0000 mg | CHEWABLE_TABLET | ORAL | Status: DC | PRN

## 2021-01-28 SURGICAL SUPPLY — 40 items
APL PRP STRL LF DISP 70% ISPRP (MISCELLANEOUS) ×1
BARRIER ADHS 3X4 INTERCEED (GAUZE/BANDAGES/DRESSINGS) ×1 IMPLANT
BRR ADH 4X3 ABS CNTRL BYND (GAUZE/BANDAGES/DRESSINGS) ×1
CHLORAPREP W/TINT 26 (MISCELLANEOUS) ×2 IMPLANT
COVER SURGICAL LIGHT HANDLE (MISCELLANEOUS) ×1 IMPLANT
DRESSING PEEL AND PLAC PRVNA20 (GAUZE/BANDAGES/DRESSINGS) IMPLANT
DRSG PEEL AND PLACE PREVENA 20 (GAUZE/BANDAGES/DRESSINGS) ×2
DRSG TELFA 3X8 NADH (GAUZE/BANDAGES/DRESSINGS) ×2 IMPLANT
ELECT REM PT RETURN 9FT ADLT (ELECTROSURGICAL) ×2
ELECTRODE REM PT RTRN 9FT ADLT (ELECTROSURGICAL) ×1 IMPLANT
GAUZE SPONGE 4X4 12PLY STRL (GAUZE/BANDAGES/DRESSINGS) ×2 IMPLANT
GOWN STRL REUS W/ TWL LRG LVL3 (GOWN DISPOSABLE) ×3 IMPLANT
GOWN STRL REUS W/TWL LRG LVL3 (GOWN DISPOSABLE) ×14
MANIFOLD NEPTUNE II (INSTRUMENTS) ×2 IMPLANT
MAT PREVALON FULL STRYKER (MISCELLANEOUS) ×2 IMPLANT
NDL HYPO 25GX1X1/2 BEV (NEEDLE) ×1 IMPLANT
NEEDLE HYPO 25GX1X1/2 BEV (NEEDLE) ×2 IMPLANT
NS IRRIG 1000ML POUR BTL (IV SOLUTION) ×2 IMPLANT
PACK C SECTION AR (MISCELLANEOUS) ×2 IMPLANT
PAD DRESSING TELFA 3X8 NADH (GAUZE/BANDAGES/DRESSINGS) ×1 IMPLANT
PAD OB MATERNITY 4.3X12.25 (PERSONAL CARE ITEMS) ×2 IMPLANT
PAD PREP 24X41 OB/GYN DISP (PERSONAL CARE ITEMS) ×2 IMPLANT
PENCIL SMOKE EVACUATOR (MISCELLANEOUS) ×2 IMPLANT
RETRACTOR TRAXI PANNICULUS (MISCELLANEOUS) IMPLANT
SCRUB EXIDINE 4% CHG 4OZ (MISCELLANEOUS) ×2 IMPLANT
SUT CHROMIC 0 CT 1 (SUTURE) ×6 IMPLANT
SUT CHROMIC 1 CTX 36 (SUTURE) ×2 IMPLANT
SUT CHROMIC 3 0 SH 27 (SUTURE) ×1 IMPLANT
SUT MNCRL 4-0 (SUTURE) ×2
SUT MNCRL 4-0 27XMFL (SUTURE) ×1
SUT PDS AB 1 TP1 96 (SUTURE) ×1 IMPLANT
SUT VIC AB 0 CT1 36 (SUTURE) ×4 IMPLANT
SUT VIC AB 0 CTX 36 (SUTURE) ×4
SUT VIC AB 0 CTX36XBRD ANBCTRL (SUTURE) ×2 IMPLANT
SUT VIC AB 2-0 SH 27 (SUTURE) ×4
SUT VIC AB 2-0 SH 27XBRD (SUTURE) ×2 IMPLANT
SUTURE MNCRL 4-0 27XMF (SUTURE) ×1 IMPLANT
SYR 30ML LL (SYRINGE) ×4 IMPLANT
TRAXI PANNICULUS RETRACTOR (MISCELLANEOUS) ×1
WATER STERILE IRR 500ML POUR (IV SOLUTION) ×2 IMPLANT

## 2021-01-28 NOTE — Progress Notes (Signed)
   01/28/21 0655  Fetal Heart Rate A  Mode External  Baseline Rate (A) 130 bpm  Variability 6-25 BPM  Accelerations 15 x 15  Decelerations None  Uterine Activity  Mode Toco  Contraction Frequency (min) none   Category 1 NST, moderate variability, 15x15 accels, no decels.

## 2021-01-28 NOTE — Anesthesia Procedure Notes (Signed)
Spinal  Patient location during procedure: OR Start time: 01/28/2021 7:43 AM End time: 01/28/2021 7:47 AM Reason for block: surgical anesthesia Staffing Performed: resident/CRNA  Anesthesiologist: Velna Hatchet, MD Resident/CRNA: Tollie Eth, CRNA Preanesthetic Checklist Completed: patient identified, IV checked, site marked, risks and benefits discussed, surgical consent, monitors and equipment checked, pre-op evaluation and timeout performed Spinal Block Patient position: sitting Prep: Betadine Patient monitoring: heart rate, continuous pulse ox, blood pressure and cardiac monitor Approach: midline Location: L4-5 Injection technique: single-shot Needle Needle type: Whitacre and Introducer  Needle gauge: 24 G Needle length: 9 cm Assessment Events: CSF return Additional Notes Negative paresthesia. Negative blood return. Positive free-flowing CSF. Expiration date of kit checked and confirmed. Patient tolerated procedure well, without complications.

## 2021-01-28 NOTE — Lactation Note (Signed)
This note was copied from a baby's chart. Lactation Consultation Note  Patient Name: Stacie Keller NATFT'D Date: 01/28/2021 Reason for consult: L&D Initial assessment;Late-preterm 34-36.6wks;Infant < 6lbs;Other (Comment) (c-section) Age:42 hours  Initial lactation visit in L&D to P3 mom who delivered via c-section 2.5 hours ago. Mom remained in OR past 1 hour, baby was swaddled in room with family friend/support person. Mom returned to room feeling nauseated but willing to try and put baby to the breast. Multiple attempts made to latch baby in football hold on R breast were unsuccessful; LC was able to hand express a few drops into the baby's mouth. With baby remaining sleepy LC suggested skin to skin time and another try in 30 minutes.  1150- LC returned to room to assist with attempted feeding efforts. Baby was still skin to skin with mom and sleeping soundly. We removed baby from the chest into football hold on the L breast; support pillows added. Again multiple attempts made to latch baby were unsuccessful. Baby once again placed on mom's chest skin to skin. LC and LC student hand expressed from both breasts into spoons and were able to spoon feed approximately 75mL to baby. Baby did not wake for the feed.  LC reswaddled baby in double warm blankets and warm hat due to recent low temperature reading, mom became sick in the bed; baby remained in bassinet. LC reviewed with mom next steps will be based on outcome of sugar levels, also preterm feeding protocol: breast+supplement+pumping.  Maternal Data Has patient been taught Hand Expression?: Yes Does the patient have breastfeeding experience prior to this delivery?: Yes How long did the patient breastfeed?: 9 months  Feeding Mother's Current Feeding Choice: Breast Milk  LATCH Score Latch: Too sleepy or reluctant, no latch achieved, no sucking elicited.  Audible Swallowing: None  Type of Nipple: Everted at rest and after  stimulation  Comfort (Breast/Nipple): Soft / non-tender  Hold (Positioning): Full assist, staff holds infant at breast  LATCH Score: 4   Lactation Tools Discussed/Used    Interventions Interventions: Breast feeding basics reviewed;Assisted with latch;Hand express;Skin to skin;Adjust position;Position options;Support pillows;Education (spoon feeding; preterm protocol; GDM)  Discharge    Consult Status Consult Status: Follow-up from L&D    Danford Bad 01/28/2021, 12:35 PM

## 2021-01-28 NOTE — Lactation Note (Signed)
This note was copied from a baby's chart. Lactation Consultation Note  Patient Name: Stacie Keller ZOXWR'[id number] Date: 01/28/2021 Reason for consult: Follow-up assessment;Late-preterm 34-36.6wks;Infant < 6lbs Age:42 hours  Lactation follow-up. Mom and baby now on [place]. Second glucose is pre-feed: 55, no more glucose checks unless symptomatic.  LC attempted to put baby to the breast in football hold on mom's L breast. Nipple is round/everted, mom is larger in size with larger breasts and vision concerns- football works well. Baby, this time, did open and latch with 5-7 sucks then took a breast. Re-latched with additional 5-7 sucks and then no longer interested. LC hand expressed out of both breasts and provided 40mL of EBM/colostrum to baby. Baby tolerated spoon and finger feed well. Baby has low temperature; when feeding efforts were complete baby placed skin to skin on mom's chest with double blankets and hat. RN preparing to have baby placed under warmer in SCN. Encouraged with continued frequent attempts 8-12x in first 24 hours with option of hand expressing/spoon feeding if baby is sleepy or uninterested.   Maternal Data Has patient been taught Hand Expression?: Yes Does the patient have breastfeeding experience prior to this delivery?: Yes How long did the patient breastfeed?: 9 months  Feeding Mother's Current Feeding Choice: Breast Milk  LATCH Score Latch: Repeated attempts needed to sustain latch, nipple held in mouth throughout feeding, stimulation needed to elicit sucking reflex.  Audible Swallowing: None  Type of Nipple: Everted at rest and after stimulation  Comfort (Breast/Nipple): Soft / non-tender  Hold (Positioning): Full assist, staff holds infant at breast  LATCH Score: 5   Lactation Tools Discussed/Used    Interventions Interventions: Assisted with latch;Hand express;Adjust position;Support pillows;Position options;Education (spoon fed)  Discharge    Consult  Status Consult Status: Follow-up Date: 01/28/21 Follow-up type: In-patient    Danford Bad 01/28/2021, 3:17 PM

## 2021-01-28 NOTE — Progress Notes (Signed)
Inpatient Diabetes Program Recommendations  AACE/ADA: New Consensus Statement on Inpatient Glycemic Control   Target Ranges:  Prepandial:   less than 140 mg/dL      Peak postprandial:   less than 180 mg/dL (1-2 hours)      Critically ill patients:  140 - 180 mg/dL    Latest Reference Range & Units 01/27/21 09:52  Glucose 70 - 99 mg/dL 329 (H)   Review of Glycemic Control  Diabetes history: DM2 Outpatient Diabetes medications: NPH 65 units QAM, NPH 45 units QPM, Regular 42 units with breakfast, Regular 27 units with lunch, Regular 37 units with supper (had been on Metformin XR 1000 mg BID prior to pregnancy per chart) Current orders for Inpatient glycemic control: None  Inpatient Diabetes Program Recommendations:    DM medication recommendations: Please consider using Glycemic Control order set to order CBGs AC&HS with Novolog 0-15 units AC&HS. Also please consider ordering Metformin XR 1000 mg BID.  NOTE: Noted consult for diabetes coordinator. Chart reviewed.  Per chart, Patient seen Dr. Nedra Hai with [facility] on 06/27/20 and per office note "Last HbA1c 8.2 (01/31/2020) and Sitagliptin increased from 25 to 50mg  daily at that time. Had persistently elevated glucoses, Metformin 500mg  daily restarted 04/02/2020 however patient did not realize she was supposed to continue Sitagliptan so she was only taking monotherapy with Metformin. At her last visit 04/2020, she did not want to take two medications and Mefformin increased to 1000mg  BID. Glucoses currently 100-150s. Trying to stay on low carb diet."  Also noted patient seen 05/31/2020, CNM on 07/24/20 for initial prenatal visit at [redacted]W[redacted]D gestation and patient was started on insulin (NPH and Regular) at that time.  Per home medication list currently in chart, patient last took NPH and Regular insulin on 01/27/21. Patient [redacted]W[redacted]D gestation and had c-section today. Lab glucose 103 mg/d at 9:52 am today and patient received Decadron 10 mg at 10:15 am  today. Recommend ordering CBGs AC&HS with Novolog 0-15 units AC&HS correction scale along with Metformin XR 1000 mg BID. Will follow along while inpatient.  Thanks, Margaretmary Eddy, RN, MSN, CDE Diabetes Coordinator Inpatient Diabetes Program [phone] (Team Pager from 8am to 5pm)

## 2021-01-28 NOTE — Op Note (Addendum)
Cesarean Section Procedure Note  Date of procedure: 01/28/2021   Pre-operative Diagnosis: Intrauterine pregnancy at [redacted]w[redacted]d;  - Prior classical cearean section - DMT2, on >200U of insulin daily - AMA, aged 40yo - cHTN - BMI 39.5 - Hypothyroidism  Post-operative Diagnosis: same, delivered. **Modified 22 for difficult case due to habitus, severe adhesive disease  I have recommended that another surgery would be significantly risky and that she should refrain from subsequent pregnancy. However, this is her second classical cesarean section, so delivery planning at a tertiary care center would be recommended.  Procedure: Repeat Vertical classical Cesarean Section through Pfannenstiel incision  Surgeon: Christeen Douglas, MD  Assistant(s):  Heloise Ochoa, CNM, Margaretmary Eddy, CNM, Donato Schultz, CNM , and Jennell Corner, MD was called for assistance mid-section  Anesthesia: Spinal anesthesia and iv meds for extended case  Anesthesiologist: No responsible provider has been recorded for the case. Anesthesiologist: Neldon Mc, MD CRNA: Hezzie Bump, CRNA; Omer Jack, CRNA  Estimated Blood Loss:           Drains: Foley Cath  Dressings: Provena wound vac placed         Total IV Fluids:  Urine Output:         Specimens: None         Complications:  None         Disposition: PACU - hemodynamically stable.         Condition: stable  Findings:  A female infant "Stacie Keller" in cephalic presentation. Amniotic fluid - Clear  Birth weight 2310g g.  Apgars of 8 and 9 at one and five minutes respectively.  Intact placenta with a three-vessel cord.  Grossly normal uterus, tubes and ovaries bilaterally. 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 No surgical plane was able developed between rectus muscles and lower uterine segment, and omentum was significantly adherent to the fascia and the uterus in the midline and at the fundus. The bladder was  never definitively visualized.  Case was difficult due to pannus positioning. Traxi used. From skin to baby x1.25hrs and case total time 2:45hrs  A second IV was obtained midsurgery when the extent of the adhesions became clear.  Indications:  see preop dx; prior classical  Procedure Details  The patient was taken to Operating Room, identified as the correct patient and the procedure verified as C-Section Delivery. A formal Time Out was held with all team members present and in agreement.  After induction of anesthesia, the patient was draped and prepped in the usual sterile manner. A Traxi pannus retractor was placed. A Pfannenstiel skin incision was made and carried down through the subcutaneous tissue to the fascia. Fascial incision was made and extended transversely with the Mayo scissors. The fascia was separated from the underlying rectus tissue superiorly. No peritoneum was ever identified.Carefully, layer by layer, we entered the peritoneal cavity, but eventually our clamping and tying brought [place] into the myometrium. A clear area was identified to maternal right about midway up the uterus and this area was cleared of omental adhesions.   Working back to the midline, a clear area of uterus was made in the center midline of the uterus, above the fetal shoulder. A second iv was placed, TXA started and 2u type and crossed.  A midline vertical incision was made, which also entered through the placenta. The amniotic sac was ruptured for clear fluid and the baby delivered atraumatically using the kiwi vacuum. Delayed cord clamping for 45 sec and then passed to  pediatricians at their request.   The placenta was removed.  The uterus was able to be exteriorized and cleared of all clot and debris. The hysterotomy was closed with running sutures of 0-Vicrylin 4 layers, with a baseball stitch as the final layer. Interceed placed.  The peritoneal cavity was cleared of all clots and debris. The uterus  was returned to the abdomen.   Careful evaluation of the rectus muscles, the omental edges and the fascia was undertaken, and bleeding controlled with cautery and suture.  The fascia was then reapproximated with running sutures of 1 PDS.  The subcutaneous tissue was reapproximated with running sutures of 0 chromic. The skin was reapproximated with 4-0 monocryl.  2ml (in 30 of 0.5% bupivicaine and 63ml of NSS) of liposomal bupivicaine placed in the fascial and skin lines. Her skin was edematous and tore easily, even with the chloroprep prior to starting.  Provena wound vac placed using sterile technique.  Instrument, sponge, and needle counts were correct prior to the abdominal closure and at the conclusion of the case.   The patient tolerated the procedure well and was transferred to the recovery room in stable condition.   Christeen Douglas, MD 01/28/2021

## 2021-01-28 NOTE — Transfer of Care (Signed)
Immediate Anesthesia Transfer of Care Note  Patient: Stacie Keller  Procedure(s) Performed: REPEAT CESAREAN SECTION  Patient Location: Mother/Baby  Anesthesia Type:Spinal  Level of Consciousness: awake, alert  and oriented  Airway & Oxygen Therapy: Patient Spontanous Breathing  Post-op Assessment: Report given to RN and Post -op Vital signs reviewed and stable  Post vital signs: Reviewed and stable  Last Vitals:  Vitals Value Taken Time  BP 115/73   Temp 98   Pulse 80   Resp 13   SpO2 98     Last Pain: There were no vitals filed for this visit.       Complications: No notable events documented.

## 2021-01-28 NOTE — Anesthesia Preprocedure Evaluation (Addendum)
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)     GI/Hepatic Neg liver ROS, GERD  ,  Endo/Other  diabetes, Type 2, Insulin DependentHypothyroidism PCOS  Renal/GU negative Renal ROS     Musculoskeletal negative musculoskeletal ROS (+)   Abdominal   Peds  Hematology negative hematology ROS (+)   Anesthesia Other Findings 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       Plan Discussed with: CRNA  Anesthesia Plan Comments: (Discussed Spinal, with GETA as backup )       Anesthesia Quick Evaluation

## 2021-01-28 NOTE — Discharge Summary (Signed)
Obstetrical Discharge Summary  Patient Name: Stacie Keller DOB: 05-Mar-1980 MRN: [id number]  Date of Admission: 01/28/2021 Date of Discharge: 01/30/2021  Primary OB: Stacie Keller   Gestational Age at Delivery: [redacted]w[redacted]d   Antepartum complications:  - Prior classical cearean section - DMT2, on >200U of insulin daily in preg, on 1000mg  metformin BID prior to pregnancy - AMA, aged 40yo - cHTN, on metoprolol - BMI 39.5 - Hypothyroidism, on synthrod  Admitting Diagnosis:  Secondary Diagnosis: Patient Active Problem List   Diagnosis Date Noted   Cesarean delivery delivered 01/30/2021   Supervision of high risk pregnancy, antepartum, third trimester 01/21/2021   Metabolic syndrome 11/14/2016   Syncope 02/03/2016   Family history of early CAD 02/03/2016   Symptomatic cholelithiasis 02/04/2015   Biliary colic    Acid reflux 09/18/2013   Absence of iris 08/01/2013   Absence of lens 08/01/2013   Conjunctival vascular abnormality 08/01/2013   Airway hyperreactivity 03/29/2013   Better eye: total vision impairment, lesser eye: total vision impairment 03/29/2013   Clinical depression 03/29/2013   Major depressive disorder with single episode 03/29/2013   Type 2 diabetes mellitus (HCC) 03/29/2013   H/O disease 08/23/2012   Congenital dysplasia of hip 03/04/2012   Common migraine with intractable migraine 10/09/2010   Benign hypertension 09/06/2010   HLD (hyperlipidemia) 09/06/2010   Adult hypothyroidism 09/06/2010   Obesity, diabetes, and hypertension syndrome (HCC) 09/06/2010   Bilateral polycystic ovarian syndrome 09/06/2010    Intrapartum complications/course:   Repeat classical cesarean section, difficult Provena placed, remove POD#4 unless still draining, then POD#7  Date of Delivery: 01/28/21 Delivered By: 01/30/21 and 3 CNM and another MD Delivery Type: repeat cesarean section, classical incision Anesthesia: spinal  Newborn Data: Live born female  Stacie Keller Birth Weight: 5 lb 1.5 oz (2310 g) APGAR: 8, 9  Newborn Delivery   Birth date/time: 01/28/2021 09:26:00 Delivery type: C-Section, Low Transverse Trial of labor: No C-section categorization: Repeat       Brief Hospital Course  Stacie Keller is a Stacie Keller who underwent cesarean section on 01/28/2021.  Patient had an uncomplicated surgery; for further details of this surgery, please refer to the operative note.  Patient had an uncomplicated postpartum course.  By time of discharge on POD#3, her pain was controlled on oral pain medications; she had appropriate lochia and was ambulating, voiding without difficulty, tolerating regular diet and passing flatus.  Wound vac removed prior to discharge - incision is healing well with no drainage.   She was deemed stable for discharge to home.     Discharge Physical Exam:  BP 112/66 (BP Location: Left Arm)   Pulse 79   Temp 98 F (36.7 C) (Oral)   Resp 20   LMP 05/09/2020   SpO2 99%   Breastfeeding Unknown   General: NAD CV: RRR Pulm: CTABL, nl effort ABD: s/nd/nt, fundus firm and below the umbilicus Lochia: moderate Incision: c/d/I, wound vac removed DVT Evaluation: LE non-ttp, no evidence of DVT on exam.  Hemoglobin  Date Value Ref Range Status  01/29/2021 8.7 (L) 12.0 - 15.0 g/dL Final  01/31/2021 29/24/4628 11.1 - 15.9 g/dL Final   HCT  Date Value Ref Range Status  01/29/2021 26.7 (L) 36.0 - 46.0 % Final   Hematocrit  Date Value Ref Range Status  08/10/2016 43.4 34.0 - 46.6 % Final    Post partum course: uncomplicated Postpartum Procedures: wound vac removed Edinburgh:  Edinburgh Postnatal Depression Scale Screening Tool 01/30/2021  I have been able to laugh  and see the funny side of things. 0  I have looked forward with enjoyment to things. 0  I have blamed myself unnecessarily when things went wrong. 0  I have been anxious or worried for no good reason. 0  I have felt scared or panicky for no good reason. 0  Things  have been getting on top of me. 0  I have been so unhappy that I have had difficulty sleeping. 0  I have felt sad or miserable. 1  I have been so unhappy that I have been crying. 0  The thought of harming myself has occurred to me. 0  Edinburgh Postnatal Depression Scale Total 1    Disposition: stable, discharge to home. Baby Feeding: breastmilk and pumping Baby Disposition: home with mom  Rh Immune globulin given: n/a Rubella vaccine given: Immune  Flu vaccine given in AP or PP setting: declined Tdap vaccine given in AP or PP setting: declined   Contraception: TBD  Prenatal Labs: Blood type/Rh --/--/A POS Performed at [facility], [address] [place], [place] [address]  [id number])  Antibody screen neg  Rubella Immune  Varicella Immune  RPR NR  HBsAg Neg  HIV NR  GC neg  Chlamydia neg  Genetic screening negative  1 hour GTT   3 hour GTT n/a  GBS neg     Plan:  Stacie Keller was discharged to home in good condition. Follow-up appointment at [facility] in 1 week and with delivering provider in 2 weeks   Discharge Medications: Allergies as of 01/30/2021       Reactions   Aspartame Other (See Comments)   Migraines   Sucrose Other (See Comments)   Migraines   Hydrocodone Nausea And Vomiting   Latex Hives, Itching, Swelling   Oxycodone Nausea And Vomiting        Medication List     STOP taking these medications    aspirin EC 81 MG tablet   HumuLIN N 100 UNIT/ML injection Generic drug: insulin NPH Human   HumuLIN R 100 units/mL injection Generic drug: insulin regular   INSULIN SYRINGE 1CC/31GX5/16" 31G X 5/16" 1 ML Misc       TAKE these medications    Combivent Respimat 20-100 MCG/ACT Aers respimat Generic drug: Ipratropium-Albuterol Inhale 1 puff into the lungs every 6 (six) hours as needed for wheezing.   HYDROmorphone 2 MG tablet Commonly known as: DILAUDID Take 1 tablet (2 mg total) by mouth every 4 (four)  hours as needed for severe pain ((when tolerating fluids)).   levothyroxine 175 MCG tablet Commonly known as: SYNTHROID Take 1 tablet (175 mcg total) by mouth daily at 6 (six) AM. Start taking on: January 31, 2021 What changed: when to take this   metformin 1000 MG (OSM) 24 hr tablet Commonly known as: FORTAMET Take 1 tablet (1,000 mg total) by mouth 2 (two) times daily with a meal.   metoprolol succinate 100 MG 24 hr tablet Commonly known as: TOPROL-XL Take 1 tablet (100 mg total) by mouth daily. Take with or immediately following a meal. Start taking on: January 31, 2021 What changed: additional instructions   prenatal multivitamin Tabs tablet Take 1 tablet by mouth daily at 12 noon.   Vitamin D3 50 MCG (2000 UT) Tabs Take 2,000 Units by mouth daily.         Follow-up Information     Linda Hedges, CNM. Schedule an appointment as soon as possible for a visit in 1 week(s).  Specialty: Certified Nurse Midwife Why: For wound re-check Contact information: [place] [address] [phone]         Benjaman Kindler, MD. Schedule an appointment as soon as possible for a visit in 2 week(s).   Specialty: Obstetrics and Gynecology Why: for post op Contact information: [place] [address] [phone]                 Signed: Linda Hedges CNM

## 2021-01-29 LAB — GLUCOSE, CAPILLARY
Glucose-Capillary: 89 mg/dL (ref 70–99)
Glucose-Capillary: 82 mg/dL (ref 70–99)
Glucose-Capillary: 105 mg/dL — ABNORMAL HIGH (ref 70–99)
Glucose-Capillary: 121 mg/dL — ABNORMAL HIGH (ref 70–99)

## 2021-01-29 LAB — CBC
HCT: 26.7 % — ABNORMAL LOW (ref 36.0–46.0)
Hemoglobin: 8.7 g/dL — ABNORMAL LOW (ref 12.0–15.0)
MCH: 25.7 pg — ABNORMAL LOW (ref 26.0–34.0)
MCHC: 32.6 g/dL (ref 30.0–36.0)
MCV: 79 fL — ABNORMAL LOW (ref 80.0–100.0)
Platelets: 230 10*3/uL (ref 150–400)
RBC: 3.38 MIL/uL — ABNORMAL LOW (ref 3.87–5.11)
RDW: 16.7 % — ABNORMAL HIGH (ref 11.5–15.5)
WBC: 14.6 10*3/uL — ABNORMAL HIGH (ref 4.0–10.5)
nRBC: 0 % (ref 0.0–0.2)

## 2021-01-29 MED ORDER — METFORMIN HCL ER 500 MG PO TB24
1000.0000 mg | ORAL_TABLET | Freq: Two times a day (BID) | ORAL | Status: DC
  Administered 2021-01-29 – 2021-01-30 (×2): 1000 mg via ORAL
  Filled 2021-01-29 (×3): qty 2

## 2021-01-29 NOTE — Progress Notes (Signed)
Postop Day  1  Subjective: no complaints, up ad lib, and tolerating PO  Doing well, no concerns. Ambulating without difficulty, pain managed with PO meds, tolerating regular diet, and voiding without difficulty.   No fever/chills, chest pain, shortness of breath, nausea/vomiting, or leg pain. No nipple or breast pain. No headache, visual changes, or RUQ/epigastric pain.  Objective: BP 101/66 (BP Location: Left Arm)   Pulse 75   Temp 98.6 F (37 C) (Oral)   Resp 20   LMP 05/09/2020   SpO2 98%   Breastfeeding Unknown    Physical Exam:  General: alert, cooperative, and no distress Breasts: soft/nontender CV: RRR Pulm: nl effort, CTABL Abdomen: soft, non-tender, active bowel sounds Uterine Fundus: firm Incision: no significant drainage, covered with Provena occlusive dressing Perineum: minimal edema, intact Lochia: appropriate DVT Evaluation: No evidence of DVT seen on physical exam.  Recent Labs    01/28/21 1222 01/29/21 0551  HGB 10.4* 8.7*  HCT 30.9* 26.7*  WBC 17.6* 14.6*  PLT 237 230    Assessment/Plan: 40 y.o. [id number] postpartum day # 1  -Continue routine postpartum care -Lactation consult PRN for breastfeeding  -Acute blood loss anemia - hemodynamically stable and asymptomatic; start PO ferrous sulfate BID with stool softeners  -Immunization status:   all immunizations up to date -Diabetes regimen changed per Diabetes coordinator recommendations.  Started on Metformin 1000mg  XR BID.  Novolog meal coverage continued.  CBG's AC and HS.   -Ideally plan to transition to oral medications for Diabetes before discharge as Berdine is legally blind and has difficulty drawing insulin at home.  Her father helped her with insulin during the pregnancy but oral medications would be easier for her to manage.     Disposition: Continue inpatient postpartum care   LOS: 1 day   Lanice Schwab, CNM 01/29/2021, 9:17 AM   ----- 01/31/2021  Certified Nurse Midwife [facility]

## 2021-01-29 NOTE — Lactation Note (Signed)
This note was copied from a baby's chart. Lactation Consultation Note  Patient Name: Stacie Keller Date: 01/29/2021 Reason for consult: Follow-up assessment;Late-preterm 34-36.6wks;Infant < 6lbs Age:42 hours  Lactation follow-up. Baby fed donor breastmilk overnight via syringe due to not latching. Baby has remained sleepy and not sustaining latches with each attempt; however mom did not call out for support until after she tried.  Mom was set-up with a pump overnight, but states she isn't getting anything.  LC and LC student reassured mom about the process of milk production, and importance of continual breast stimulation, minimum of every 3 hours. We also discussed finger/syringe feedings as a way to help baby establish a sucking pattern w/ reward. LC syringe/finger fed baby 86mL wi/ frequent burp breaks; baby tolerated well.  Feeding plan today: -Every 3 hours- Attempt at breast (no more than 10 minutes) -Provide supplement (DBM) 18mL or more if desired -Pump every 3 hours  Maternal Data Has patient been taught Hand Expression?: Yes Does the patient have breastfeeding experience prior to this delivery?: Yes How long did the patient breastfeed?: 9 months  Feeding Mother's Current Feeding Choice: Breast Milk  LATCH Score Latch: Repeated attempts needed to sustain latch, nipple held in mouth throughout feeding, stimulation needed to elicit sucking reflex.  Audible Swallowing: None  Type of Nipple: Everted at rest and after stimulation  Comfort (Breast/Nipple): Soft / non-tender  Hold (Positioning): Assistance needed to correctly position infant at breast and maintain latch.  LATCH Score: 6   Lactation Tools Discussed/Used Tools: Pump Breast pump type: Double-Electric Breast Pump Pump Education: Setup, frequency, and cleaning;Milk Storage Reason for Pumping: preterm protocol Pumping frequency: q 3 hrs  Interventions Interventions: Assisted with latch;Hand  express;Support pillows;Position options;Education;Pace feeding (finger feed)  Discharge Pump: Personal  Consult Status Consult Status: Follow-up Date: 01/29/21 Follow-up type: In-patient    Danford Bad 01/29/2021, 10:22 AM

## 2021-01-29 NOTE — Anesthesia Postprocedure Evaluation (Signed)
Anesthesia Post Note  Patient: Psychologist, educational  Procedure(s) Performed: REPEAT CESAREAN SECTION  Patient location during evaluation: PACU Anesthesia Type: Spinal Level of consciousness: awake and alert Pain management: pain level controlled Vital Signs Assessment: post-procedure vital signs reviewed and stable Respiratory status: spontaneous breathing, nonlabored ventilation, respiratory function stable and patient connected to nasal cannula oxygen Cardiovascular status: blood pressure returned to baseline and stable Postop Assessment: no apparent nausea or vomiting Anesthetic complications: no   No notable events documented.   Last Vitals:  Vitals:   01/29/21 0333 01/29/21 0728  BP: 101/64 101/66  Pulse: 73 75  Resp: 18 20  Temp: 36.9 C 37 C  SpO2: 98% 98%    Last Pain:  Vitals:   01/29/21 0728  TempSrc: Oral  PainSc:                  Stacie Keller

## 2021-01-29 NOTE — Lactation Note (Signed)
This note was copied from a baby's chart. Lactation Consultation Note  Patient Name: Stacie Keller [id number] Date: 01/29/2021 Reason for consult: Follow-up assessment Age:40 years  Lactation check-in. Mom had not called out for support or supplement as of yet. LC entered room- mom changing diaper and she said she was just about to try. Baby was more alert/active, and showing cues by turning in when mom was holding her.  Mom attempted in cradle hold, but baby was turned out- LC provided assistance with repositioning her- mom continues to have a hard time seeing if baby has the nipple. On L breast in cradle hold (with positional change and support) baby did latch and have 10 sucks before holding nipple in mouth. Mom then moved to other side; again [facility] provided positional support and alignment, baby had wide open mouth with a rhythmic suck pattern for about 2 minutes before not longer giving effort. LC discussed with mom her feeding plan once discharged tomorrow. Mom strongly desires to only provide breastmilk and is hoping that her milk will "come in" tonight. LC prompted other strategies as she continues to work on building supply for baby and as baby grows and matures- We discussed the introduction of a bottle, and possibly transitioning to formula overnight/early tomorrow, explaining that donor milk can't be sent home in high enough volumes to sustain baby. LC also stressed the importance of continuing on a 3 hour schedule with pumping and breast stimulation overnight tonight- and the importance of her being diligent to aid in bringing in her milk supply to meet baby's needs. This last supplement was 78mL of DBM via bottle with slow flow nipple. Mom taught paced bottle feeding and breaking for burps. Mom was doing well with this and baby was tolerating the flow of bottle well.  Maternal Data Has patient been taught Hand Expression?: Yes Does the patient have breastfeeding experience prior to this  delivery?: Yes  Feeding Mother's Current Feeding Choice: Breast Milk  LATCH Score Latch: Grasps breast easily, tongue down, lips flanged, rhythmical sucking.  Audible Swallowing: None  Type of Nipple: Everted at rest and after stimulation  Comfort (Breast/Nipple): Soft / non-tender  Hold (Positioning): Assistance needed to correctly position infant at breast and maintain latch.  LATCH Score: 7   Lactation Tools Discussed/Used Tools: Pump Breast pump type: Double-Electric Breast Pump Reason for Pumping: preterm protocol; poor latching  Interventions Interventions: Assisted with latch;Hand express;Adjust position;Support pillows;Position options;Education;DEBP  Discharge Pump: Personal  Consult Status Consult Status: Follow-up Date: 01/30/21 Follow-up type: In-patient    Danford Bad 01/29/2021, 5:45 PM

## 2021-01-29 NOTE — Lactation Note (Signed)
This note was copied from a baby's chart. Lactation Consultation Note  Patient Name: Stacie Keller [id number] Date: 01/29/2021 Reason for consult: Follow-up assessment;Late-preterm 34-36.6wks;Infant < 6lbs Age:42 hours  Lactation at bedside to assist with 1pm feeding. LC changed dirty diaper to help stimulate/wake baby. Baby brought to mom with little effort at the breast. After 5 minutes LC began syringe feeding, as baby became more alert we tried putting baby back at the breast. She did open wide and grasp the breast and had 2 minutes of sucking. Syringe was placed in corner of mouth to keep baby interested.  The feeding was finished via finger/syringe feeding; tolerated 12 mL well, several burps, no gagging. [facility] student assisted mom with pumping while baby was being fed.  Encouraged continued efforts. Next feeding around 1600-1630.  Maternal Data Has patient been taught Hand Expression?: Yes Does the patient have breastfeeding experience prior to this delivery?: Yes  Feeding Mother's Current Feeding Choice: Breast Milk  LATCH Score Latch: Grasps breast easily, tongue down, lips flanged, rhythmical sucking.  Audible Swallowing: A few with stimulation (swallows with supplement at breast)  Type of Nipple: Everted at rest and after stimulation  Comfort (Breast/Nipple): Soft / non-tender  Hold (Positioning): Assistance needed to correctly position infant at breast and maintain latch.  LATCH Score: 8   Lactation Tools Discussed/Used Tools: Pump Breast pump type: Double-Electric Breast Pump Reason for Pumping: preterm protocol  Interventions Interventions: Assisted with latch;DEBP;Education (finger feed syringe)  Discharge Pump: Personal  Consult Status Consult Status: Follow-up Date: 01/29/21 Follow-up type: In-patient    Danford Bad 01/29/2021, 1:56 PM

## 2021-01-29 NOTE — Lactation Note (Incomplete)
This note was copied from a baby's chart. Lactation Consultation Note  Patient Name: Stacie Keller [id number] Date: 01/29/2021 Reason for consult: Follow-up assessment;Late-preterm 34-36.6wks;Infant < 6lbs Age:42 hours  Maternal Data Has patient been taught Hand Expression?: Yes Does the patient have breastfeeding experience prior to this delivery?: Yes How long did the patient breastfeed?: 9 months  Feeding Mother's Current Feeding Choice: Breast Milk  LATCH Score Latch: Repeated attempts needed to sustain latch, nipple held in mouth throughout feeding, stimulation needed to elicit sucking reflex.  Audible Swallowing: None  Type of Nipple: Everted at rest and after stimulation  Comfort (Breast/Nipple): Soft / non-tender  Hold (Positioning): Assistance needed to correctly position infant at breast and maintain latch.  LATCH Score: 6   Lactation Tools Discussed/Used Tools: Pump Breast pump type: Double-Electric Breast Pump Pump Education: Setup, frequency, and cleaning;Milk Storage Reason for Pumping: preterm protocol Pumping frequency: q 3 hrs  Interventions Interventions: Assisted with latch;Hand express;Support pillows;Position options;Education;Pace feeding (finger feed)  Discharge Pump: Personal  Consult Status Consult Status: Follow-up Date: 01/29/21 Follow-up type: In-patient    Audelia Acton 01/29/2021, 10:32 AM

## 2021-01-30 LAB — GLUCOSE, CAPILLARY
Glucose-Capillary: 80 mg/dL (ref 70–99)
Glucose-Capillary: 80 mg/dL (ref 70–99)

## 2021-01-30 MED ORDER — LEVOTHYROXINE SODIUM 175 MCG PO TABS: 175.0000 ug | ORAL_TABLET | Freq: Every day | ORAL | 11 refills | Status: AC

## 2021-01-30 MED ORDER — IBUPROFEN 600 MG PO TABS
600.0000 mg | ORAL_TABLET | Freq: Four times a day (QID) | ORAL | Status: DC
  Administered 2021-01-30: 600 mg via ORAL
  Filled 2021-01-30: qty 1

## 2021-01-30 MED ORDER — METFORMIN HCL ER (OSM) 1000 MG PO TB24: 1000.0000 mg | ORAL_TABLET | Freq: Two times a day (BID) | ORAL | 11 refills | Status: AC

## 2021-01-30 MED ORDER — METOPROLOL SUCCINATE ER 100 MG PO TB24: 100.0000 mg | ORAL_TABLET | Freq: Every day | ORAL | 11 refills | Status: AC

## 2021-01-30 MED ORDER — HYDROMORPHONE HCL 2 MG PO TABS: 2.0000 mg | ORAL_TABLET | ORAL | 0 refills | Status: AC | PRN

## 2021-01-30 NOTE — Lactation Note (Signed)
This note was copied from a baby's chart. Lactation Consultation Note  Patient Name: Stacie Keller [id number] Date: 01/30/2021 Reason for consult: Follow-up assessment;Late-preterm 34-36.6wks;Infant < 6lbs Age:42 hours  Lactation follow-up before anticipated discharge. Mom reports continuing with feeding plan overnight, and that baby did latch early this morning on both sides- this improved mom's confidence. LC reviewed with mom the possible need for continued supplementation even with breastfeeding d/t infants age, tiring out quickly/lack of stamina. We discussed continued feeding plan: -Feed every 3 hours- offer breast first -Supplement post feedings -pump minimum of every 3 hours Information given for outpatient lactation services and community breastfeeding support. Baby will be followed by office in [facility]; also encouraged mom to ask at the baby's check up appointment tomorrow what their support is as well.  Maternal Data Has patient been taught Hand Expression?: Yes Does the patient have breastfeeding experience prior to this delivery?: Yes How long did the patient breastfeed?: 9 months  Feeding Mother's Current Feeding Choice: Breast Milk Nipple Type: Slow - flow  LATCH Score                    Lactation Tools Discussed/Used Tools: Pump;Bottle Breast pump type: Double-Electric Breast Pump Reason for Pumping: 36wk; poor initial latch Pumping frequency: q 3 hours  Interventions Interventions: Assisted with latch;Education;DEBP  Discharge Pump: Personal  Consult Status Consult Status: Complete Date: 01/30/21 Follow-up type: Call as needed    Danford Bad 01/30/2021, 9:54 AM

## 2021-01-30 NOTE — Discharge Instructions (Signed)
Discharge Instructions:   Follow-up Appointment:  If there are any new medications, they have been ordered and will be available for pickup at the listed pharmacy on your way home from the hospital.   Call office if you have any of the following: headache, visual changes, fever >101.0 F, chills, shortness of breath, breast concerns, excessive vaginal bleeding, incision drainage or problems, leg pain or redness, depression or any other concerns. If you have vaginal discharge with an odor, let your doctor know.   It is normal to bleed for up to 6 weeks. You should not soak through more than 1 pad in 1 hour. If you have a blood clot larger than your fist with continued bleeding, call your doctor.   Activity: Do not lift > 10 lbs for 6 weeks (do not lift anything heavier than your baby). No intercourse, tampons, swimming pools, hot tubs, baths (only showers) for 6 weeks.  No driving for 1-2 weeks. Continue prenatal vitamin, especially if breastfeeding. Increase calories and fluids (water) while breastfeeding.   Your milk will come in, in the next couple of days (right now it is colostrum). You may have a slight fever when your milk comes in, but it should go away on its own.  If it does not, and rises above 101 F please call the doctor. You will also feel achy and your breasts will be firm. They will also start to leak. If you are breastfeeding, continue as you have been and you can pump/express milk for comfort.   If you have too much milk, your breasts can become engorged, which could lead to mastitis. This is an infection of the milk ducts. It can be very painful and you will need to notify your doctor to obtain a prescription for antibiotics. You can also treat it with a shower or hot/cold compress.   For concerns about your baby, please call your pediatrician.  For breastfeeding concerns, the lactation consultant can be reached at [phone].   Postpartum blues (feelings of happy one minute  and sad another minute) are normal for the first few weeks but if it gets worse let your doctor know.   Congratulations! We enjoyed caring for you and your new bundle of joy! Discharge Instructions:   Follow-up Appointment:  If there are any new medications, they have been ordered and will be available for pickup at the listed pharmacy on your way home from the hospital.   Call office if you have any of the following: headache, visual changes, fever >101.0 F, chills, shortness of breath, breast concerns, excessive vaginal bleeding, incision drainage or problems, leg pain or redness, depression or any other concerns. If you have vaginal discharge with an odor, let your doctor know.   It is normal to bleed for up to 6 weeks. You should not soak through more than 1 pad in 1 hour. If you have a blood clot larger than your fist with continued bleeding, call your doctor.   Activity: Do not lift > 10 lbs for 6 weeks (do not lift anything heavier than your baby). No intercourse, tampons, swimming pools, hot tubs, baths (only showers) for 6 weeks.  No driving for 1-2 weeks. Continue prenatal vitamin, especially if breastfeeding. Increase calories and fluids (water) while breastfeeding.   Your milk will come in, in the next couple of days (right now it is colostrum). You may have a slight fever when your milk comes in, but it should go away on its own.  If   it does not, and rises above 101 F please call the doctor. You will also feel achy and your breasts will be firm. They will also start to leak. If you are breastfeeding, continue as you have been and you can pump/express milk for comfort.   If you have too much milk, your breasts can become engorged, which could lead to mastitis. This is an infection of the milk ducts. It can be very painful and you will need to notify your doctor to obtain a prescription for antibiotics. You can also treat it with a shower or hot/cold compress.   For concerns about  your baby, please call your pediatrician.  For breastfeeding concerns, the lactation consultant can be reached at [phone].   Postpartum blues (feelings of happy one minute and sad another minute) are normal for the first few weeks but if it gets worse let your doctor know.   Congratulations! We enjoyed caring for you and your new bundle of joy!  

## 2021-01-30 NOTE — Progress Notes (Signed)
Postop Day  2  Subjective: no complaints, up ad lib, and tolerating PO  Doing well, no concerns. Ambulating without difficulty, pain managed with PO meds, tolerating regular diet, and voiding without difficulty.   No fever/chills, chest pain, shortness of breath, nausea/vomiting, or leg pain. No nipple or breast pain. No headache, visual changes, or RUQ/epigastric pain.  Objective: BP 106/63 (BP Location: Right Arm)   Pulse 77   Temp 97.6 F (36.4 C) (Oral)   Resp 18   LMP 05/09/2020   SpO2 100%   Breastfeeding Unknown    Physical Exam:  General: alert, cooperative, and no distress Breasts: soft/nontender CV: RRR Pulm: nl effort Abdomen: soft, non-tender Uterine Fundus: firm Incision: no significant drainage, covered with Provena occlusive dressing Perineum: minimal edema, intact Lochia: appropriate DVT Evaluation: No evidence of DVT seen on physical exam.  Recent Labs    01/28/21 1222 01/29/21 0551  HGB 10.4* 8.7*  HCT 30.9* 26.7*  WBC 17.6* 14.6*  PLT 237 230     Assessment/Plan: 40 y.o. [id number] postpartum day # 1  -Continue routine postpartum care -Lactation consult PRN for breastfeeding  -Acute blood loss anemia - hemodynamically stable and asymptomatic; start PO ferrous sulfate BID with stool softeners  -Immunization status:   all immunizations up to date -Diabetes regimen changed per Diabetes coordinator recommendations.  Started on Metformin 1000mg  XR BID.  Novolog meal coverage continued.  CBG's AC and HS.   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  Her father helped her with insulin during the pregnancy but oral medications would be easier for her to manage.     Disposition: Continue inpatient postpartum care   LOS: 2 days   Lanice Schwab, CNM 01/30/2021, 8:35 AM

## 2021-01-30 NOTE — Progress Notes (Signed)
Mother discharged.  Discharge instructions given.  Mother verbalizes understanding.  Transported by auxiliary.  

## 2021-01-30 NOTE — Progress Notes (Signed)
Mother watched infant cpr video.

## 2021-01-30 NOTE — Progress Notes (Signed)
Inpatient Diabetes Program Recommendations  AACE/ADA: New Consensus Statement on Inpatient Glycemic Control  Target Ranges:  Prepandial:   less than 140 mg/dL      Peak postprandial:   less than 180 mg/dL (1-2 hours)      Critically ill patients:  140 - 180 mg/dL    Latest Reference Range & Units 01/30/21 08:02  Glucose-Capillary 70 - 99 mg/dL 80    Latest Reference Range & Units 01/29/21 09:38 01/29/21 11:12 01/29/21 17:38 01/29/21 23:09  Glucose-Capillary 70 - 99 mg/dL 89 82 716 (H) 967 (H)   Review of Glycemic Control  Diabetes history: DM2 Outpatient Diabetes medications: NPH 65 units QAM, NPH 45 units QPM, Regular 42 units with breakfast, Regular 27 units with lunch, Regular 37 units with supper (had been on Metformin XR 1000 mg BID prior to pregnancy per chart) Current orders for Inpatient glycemic control: Metformin XR 1000 mg BID, Novolog 0-15 units BID  Inpatient Diabetes Program Recommendations:    Outpatient DM: Would recommend discharging patient on Metformin XR 1000 mg BID and have patient follow up with PCP regarding DM control.  NOTE: Noted consult for diabetes coordinator. Chart reviewed.  Per chart, Patient seen Dr. Nedra Hai with [facility] on 06/27/20 and per office note "Last HbA1c 8.2 (01/31/2020) and Sitagliptin increased from 25 to 50mg  daily at that time. Had persistently elevated glucoses, Metformin 500mg  daily restarted 04/02/2020 however patient did not realize she was supposed to continue Sitagliptan so she was only taking monotherapy with Metformin. At her last visit 04/2020, she did not want to take two medications and Mefformin increased to 1000mg  BID. Glucose currently 100-150s. Trying to stay on low carb diet."  Also noted patient seen 05/31/2020, CNM on 07/24/20 for initial prenatal visit at [redacted]W[redacted]D gestation and patient was started on insulin (NPH and Regular) at that time.  Patient has c-section on 01/28/21 and glucose has ranged from 69-130 mg/dl since  delivery and fasting glucose 80 mg/dl this morning.   Thanks, Margaretmary Eddy, RN, MSN, CDE Diabetes Coordinator Inpatient Diabetes Program [phone] (Team Pager from 8am to 5pm)

## 2021-01-30 NOTE — TOC Initial Note (Signed)
Transition of Care [facility]) - Initial/Assessment Note    Patient Details  Name: Stacie Keller MRN: [id number] Date of Birth: [birth year]  [facility] CM/SW Contact:    Starkville Cellar, RN Phone Number: 01/30/2021, 4:16 PM  Clinical Narrative:                 Spoke to patient at bedside with infant present. Patient reports she is eager to discharge home and has strong support system including 42 year old "adopted" child and 62 year old biological child. Patient reports she is planning to outreach to [facility] services today. Has selected [facility]. Patient is a [facility] and is active with virtual therapy through [facility] for previous MH issues. Patient reports her father assists her with transportation as needed and she has a strong support system of family and friends helping her. Has all needed equipment and no TOC needs.         Patient Goals and CMS Choice        Expected Discharge Plan and Services           Expected Discharge Date: 01/24/21                                    Prior Living Arrangements/Services                       Activities of Daily Living      Permission Sought/Granted                  Emotional Assessment              Admission diagnosis:  Diabetes mellitus during pregnancy treated with insulin (HCC) [O24.919, Z79.4] Patient Active Problem List   Diagnosis Date Noted   Cesarean delivery delivered 01/30/2021   Supervision of high risk pregnancy, antepartum, third trimester 01/21/2021   Metabolic syndrome 11/14/2016   Syncope 02/03/2016   Family history of early CAD 02/03/2016   Symptomatic cholelithiasis 02/04/2015   Biliary colic    Acid reflux 09/18/2013   Absence of iris 08/01/2013   Absence of lens 08/01/2013   Conjunctival vascular abnormality 08/01/2013   Airway hyperreactivity 03/29/2013   Better eye: total vision impairment, lesser eye: total vision impairment 03/29/2013   Clinical  depression 03/29/2013   Major depressive disorder with single episode 03/29/2013   Type 2 diabetes mellitus (HCC) 03/29/2013   H/O disease 08/23/2012   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), [place] - [address] [place]) [place] [address] Phone: [phone] Fax: [phone]     Social Determinants of Health (SDOH) Interventions    Readmission Risk Interventions No flowsheet data found.

## 2021-02-01 ENCOUNTER — Ambulatory Visit: Payer: Self-pay

## 2021-02-01 NOTE — Lactation Note (Signed)
This note was copied from a baby's chart. Lactation Consultation Note  Patient Name: Stacie Keller PNPYY'F Date: 02/01/2021 Reason for consult: Initial assessment;Late-preterm 34-36.6wks;Infant < 6lbs AMA  Age:42 days  LC in to visit with P3 Mom of 4 day old LPTI that was readmitted for temperature instability.  Baby is at 5.6% weight loss and weighs 4 lbs 12.9 oz today. Baby's temp at Pediatrician office the day after discharge from MB at [facility] was 94.  Mom reported baby was not feeding well.  Baby on Pediatric Unit initially under radiant warmer.  Temps are now WNL, baby has an IV.  Mom is feeding baby 22 cal formula by bottle, last feeding 30 mins ago was 58 ml.  Baby sleeping soundly swaddled.  Mom reports having difficulty with her milk supply with her 34 yr old.  Mom has been pumping at home using the [facility] DEBP she received from [facility]. Mom reports expressing drops only.   LC set up the Medela Symphony and assisted Mom to pump on initiation setting.  27 mm flanges fitted.  Mom will do breast massage and hand expression along with STS with baby as much as possible.  Encouraged Mom to ask for help placing baby on her chest.   Mom states that baby had been latching and sucking for a couple minutes only.  Recommended hand expressing drop onto nipple first.  Mom encouraged to pump every 1 1/2-3 hrs with a 4 hr span at night.  Reviewed importance of disassembling pump parts, washing, rinsing and air drying in separate bin provided.  Current feeding plan-  1- STS when able to 2-Offer breast with cues, though not to count it as a feeding.  Lick and learn and STS nuzzling is normal with LPTI 3-Supplement baby with 30-60 ml EBM+/22 cal formula by paced bottle, Mom knows to feed baby her EBM first. 4- Do breast massage and hand expression along with double pumping using the hospital grade Medela Symphony DEBP every 2-3 hrs.  A 4 hr stretch at night    Mom to ask baby's nurse  to call lactation if she would like a F/U consult.  Mom denies any questions currently.  Feeding Mother's Current Feeding Choice: Breast Milk and Formula Nipple Type: Slow - flow   Lactation Tools Discussed/Used Tools: Pump;Flanges;Bottle Flange Size: 27 Breast pump type: Double-Electric Breast Pump Pump Education: Setup, frequency, and cleaning;Milk Storage Reason for Pumping: Support milk supply/LPTI/<5 lbs/history of low milk supply Pumping frequency: Q 3hrs Pumped volume: 0 mL (drops hand expressed)  Interventions Interventions: Breast feeding basics reviewed;Skin to skin;Breast massage;Hand express;DEBP;Pace feeding  Discharge Pump: Personal Ophelia Shoulder DEBP from Medicaid)  Consult Status Consult Status: PRN Date: 02/01/21 Follow-up type: Call as needed    Judee Clara 02/01/2021, 4:05 PM

## 2021-02-02 ENCOUNTER — Ambulatory Visit: Payer: Self-pay

## 2021-02-02 NOTE — Lactation Note (Signed)
This note was copied from a baby's chart. Lactation Consultation Note  Patient Name: Stacie Keller Today's Date: 02/02/2021 Reason for consult: Follow-up assessment;Difficult latch;Late-preterm 34-36.6wks;Infant < 6lbs;Maternal endocrine disorder Age:42 days  LC in to visit with Mom of LPTI. Baby at 3.5% weight loss weighing 4 lbs 14.7 oz today.  Baby has been supplemented with 22 cal formula after breastfeeding, volumes of 15-58 ml.  Baby has been having stable temperatures.  SLP consult today and Mom using a Nfant slow flow nipple and pace feeding in side lying position.  Mom about to latch baby.  Mom using cross cradle, LC added pillow support and adjusted Mom's hands to support baby's head from ear to ear and support her breast in a U hold.  LC placed rolled up cloth under breast for support.    Baby isn't able to open her mouth wide enough to latch onto breast deeply.  Baby on Mom's nipple tip only.  Because baby was showing interest, LC initiated a 20 mm nipple shield, showing Mom how to properly apply by inverting NS part way and stretching over her nipple.  Nipple was pulled 1/3 of the way into shield.  Assisted Mom to latch baby.  Baby still had difficulty latching onto the shield.  LC adjusted baby's lips to flange them and she was able to attain a latch to base of nipple.  Baby sucked a couple bursts for 2 minutes and then stopped.  When baby taken off the breast, small drops of colostrum noted in shield.  Assisted Mom to finger feed this to baby prior to her bottle supplement.  Encouraged Mom to pump after each feeding, both breasts on initiation setting.  Encouraged STS with baby as much as possible.  Mom appreciative of help and denies any further questions.  LATCH Score Latch: Repeated attempts needed to sustain latch, nipple held in mouth throughout feeding, stimulation needed to elicit sucking reflex.  Audible Swallowing: None  Type of Nipple: Everted at rest  and after stimulation  Comfort (Breast/Nipple): Soft / non-tender  Hold (Positioning): Full assist, staff holds infant at breast  LATCH Score: 5   Lactation Tools Discussed/Used Tools: Pump;Flanges;Nipple Dorris Carnes;Bottle Nipple shield size: 20 Flange Size: 27 Breast pump type: Double-Electric Breast Pump;Manual Pumping frequency: Trying to pump every 3 hrs., Pumped volume: 0 mL  Interventions Interventions: Breast feeding basics reviewed;Assisted with latch;Skin to skin;Breast massage;Hand express;Breast compression;Adjust position;Support pillows;Position options;Hand pump;DEBP;Pace feeding  Consult Status Consult Status: Follow-up Date: 02/03/21 Follow-up type: In-patient    Judee Clara 02/02/2021, 1:27 PM

## 2021-02-12 ENCOUNTER — Ambulatory Visit: Payer: Self-pay

## 2021-02-12 NOTE — Lactation Note (Signed)
This note was copied from a baby's chart. Lactation Consultation Note  Patient Name: Stacie Keller Today's Date: 02/12/2021 Reason for consult: Late-preterm 34-36.6wks;Infant < 6lbs;Difficult latch;Follow-up assessment;Maternal endocrine disorder Age:42 wk.o.  LC in to visit with P41 Mom of 49 week old baby that was admitted to [facility] for hypothermia.  Baby's weight is 5 lbs 7.7 oz today, which is 6.2 oz above birth weight.  Mom states that baby has been breastfeeding really well at home.  Mom reports feeding baby every 2-3 hrs and waking baby if needed.  Mom also reports supplementing baby if she doesn't breastfeed well.  Mom report baby regurgitates regularly with formula supplementation, sometimes causing baby to struggle to catch her breath.  Mom reports baby doesn't spit with breast feeding or EBM by bottle.  Mom has been pumping, but not consistently.  Mom states she pumps usually 15 ml and sometimes up to 30 ml.  Mom started on Reglan Rx for low milk supply.  Reviewed possible side effects to watch for (increased depression).    Mom states that baby has been too sleepy to breastfeed since admission last evening.  Offered to assess/assist with next feeding.   Baby crying.  LC and SLP noted a posterior short lingual frenulum and a U shaped tongue.  Baby noted to have a labial frenulum down to gum line.  Mom states baby often tucks in her upper lip when feeding.  Baby awakened and after RN did her checks, baby placed STS in cross cradle hol.  Mom about to use cradle hold, watched as she latched baby.  Mom using a 24 mm nipple shield that she purchased on [employer].  Mom applied it well with her nipple pulled well into shield.  Baby opens her mouth barely wide enough for nipple, Mom's fingers on the shield.  Before LC adjusted Mom's positioning of baby, baby sucking on and off the nipple shaft.    Assisted Mom in supporting and sandwiching her breast in a U hold away from shield.   Showed Mom how to make sure baby's mouth is wide open onto breast.  Baby noted to have some jaw extensions and a few swallows.  Baby sleepy on the breast, needing stimulation to continue feeding.  After 7 mins, baby taken off.  No visible milk noted in shield.    Mom also has poor vision which is a risk factor for not being able to assess baby's latch to breast.  SLP here to do an evaluation on baby.  LC provided Mom with a hand's free pumping band and assisted her to pump both breasts.  Mom to use maintenance setting for 15-30 mins.  Plan recommended by lactation- 1-Keep baby STS as much as possible 2- At least every 3 hrs, sooner if baby is hungry, offer the breast until baby tires, no longer than 30 mins. 3- Supplement baby with EBM+/formula using Dr. Theora Gianotti bottle provided by SLP. 4- Pump both breasts 15-30 mins 5- A weighted feeding at the breast before discharge would be warranted.  Mom given Tongue Tie resource handout.   Explained to Mom that baby isn't appearing to be able to transfer adequate milk from the breast currently.  Baby should be supplemented with EBM adding formula if needed.  Mom also encouraged to pump with the hospital grade pump consistently to maximize her milk supply (8-12 times per 24 hrs)  Mom states she was given a Spectra pump but needs to order pump parts.  Feeding Mother's Current Feeding Choice: Breast Milk and Formula Nipple Type: Dr. Irving Burton Preemie (yellow hospital nipple too fast)  LATCH Score Latch: Repeated attempts needed to sustain latch, nipple held in mouth throughout feeding, stimulation needed to elicit sucking reflex.  Audible Swallowing: A few with stimulation  Type of Nipple: Everted at rest and after stimulation  Comfort (Breast/Nipple): Soft / non-tender  Hold (Positioning): Assistance needed to correctly position infant at breast and maintain latch.  LATCH Score: 7   Lactation Tools Discussed/Used Tools: Pump;Flanges;Nipple  Dorris Carnes;Bottle;Hands-free pumping top Nipple shield size: 24 Flange Size: 24 Breast pump type: Double-Electric Breast Pump Pump Education: Setup, frequency, and cleaning;Milk Storage Reason for Pumping: support milk supply Pumping frequency: Mom encouraged to pump both breasts 15-30 mins every 2-3 hrs during the day and 3-4 hrs at night. Pumped volume: 15 mL  Interventions Interventions: Breast feeding basics reviewed;Assisted with latch;Skin to skin;Breast massage;Hand express;Breast compression;Adjust position;Support pillows;Position options;DEBP;Pace feeding;Education  Discharge Pump: Personal  Consult Status Consult Status: Follow-up Date: 02/13/21 Follow-up type: In-patient    Stacie Keller 02/12/2021, 3:39 PM

## 2021-02-13 ENCOUNTER — Ambulatory Visit: Payer: Self-pay

## 2021-02-13 NOTE — Lactation Note (Signed)
This note was copied from a baby's chart. Lactation Consultation Note  Patient Name: Sheritta Deeg KJIZX'Y Date: 02/13/2021   Age:42 wk.o. LC just spoke with Staff nurse Susette Racer RN., about mothers decision to stop breastfeeding and begin only formula feeding.  Informed staff nurse that [facility] would visit to review treatment and prevention for engorgement .   Maternal Data    Feeding Nipple Type: Dr. Lorne Skeens  [facility] Score                    Lactation Tools Discussed/Used    Interventions    Discharge    Consult Status      Michel Bickers 02/13/2021, 11:44 AM

## 2021-02-13 NOTE — Lactation Note (Addendum)
This note was copied from a baby's chart. Lactation Consultation Note  Patient Name: Stacie Keller Today's Date: 02/13/2021 Reason for consult: Follow-up assessment Age:42 wk.o.  P2 Mother, Mother reports that she is now giving formula and is not breastfeeding or pumping.   Discussed treatment and prevention of engorgement.  Advised mother to use ice for swelling . Ice for 15 mins every 3-4 hours .  Mother reports that she has a few  small knots in both breast. Advised to massage those away. Limit massage to breast.  Mother somewhat discouraged and difficult to connect with . Peds reports that Psychologist to see mother today for support. Support and encouragement given . Mother to page [facility] or call for [facility] follow up as needed for questions or concerns.    Maternal Data    Feeding Mother's Current Feeding Choice: Formula Nipple Type: Dr. Lorne Skeens  [facility] Score                    Lactation Tools Discussed/Used    Interventions Interventions: Ice;Education;Breast massage;Skin to skin  Discharge Discharge Education: Engorgement and breast care  Consult Status Consult Status: Complete    Michel Bickers 02/13/2021, 1:13 PM

## 2021-11-28 IMAGING — US US FETAL BPP W/O NONSTRESS
1 series · 15 of 26 positions shown · non-contrast
Comparison: none

CLINICAL DATA: 40-year-old pregnant female with diabetes mellitus.

EXAM:
LIMITED OBSTETRIC ULTRASOUND AND BIOPHYSICAL PROFILE

[Series 1: us fetal bpp wo nonstress (hospital) · 26 acquisitions, 15 frames shown]
[im 1/26]
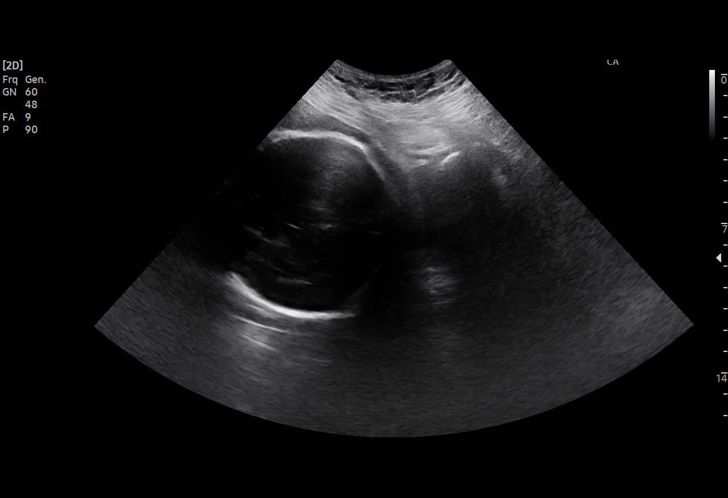
[im 3/26]
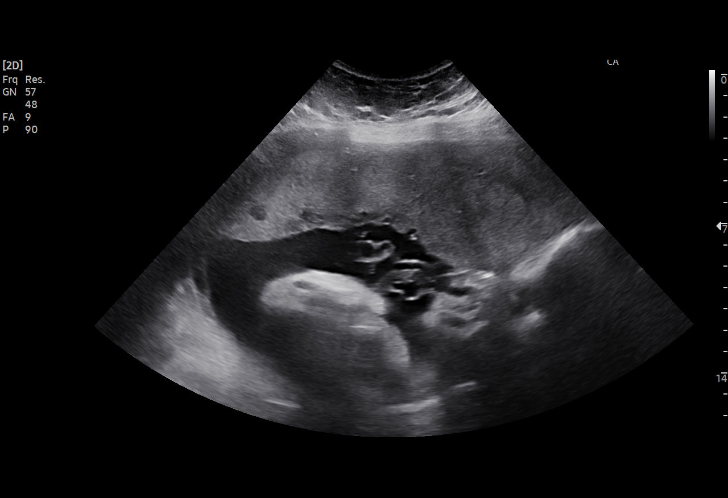
[im 5/26]
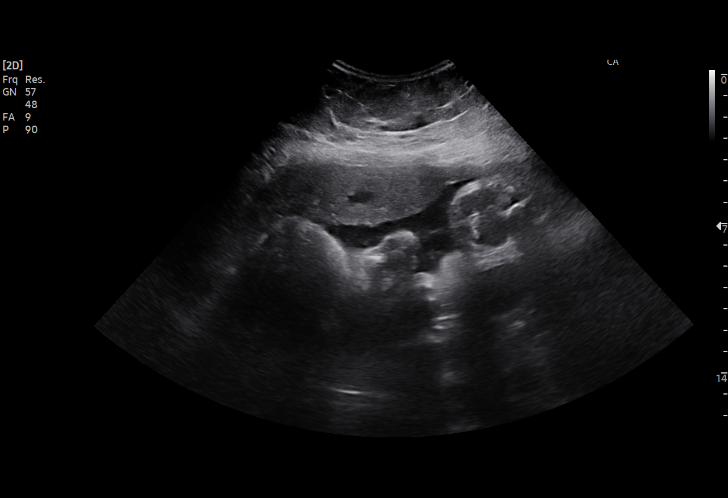
[im 7/26]
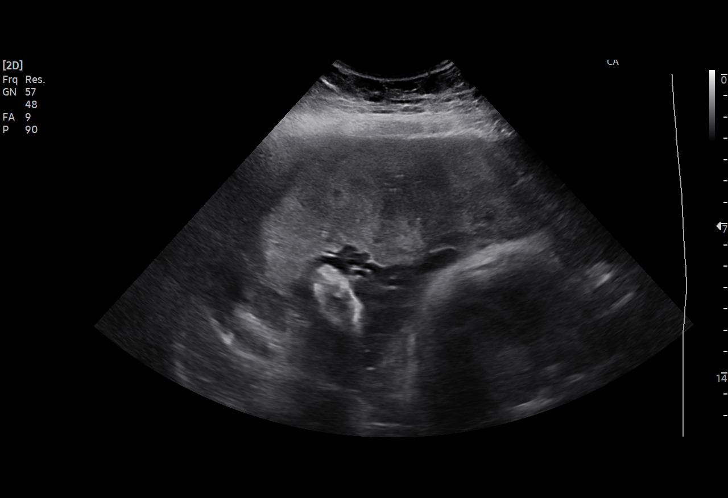
[im 8/26]
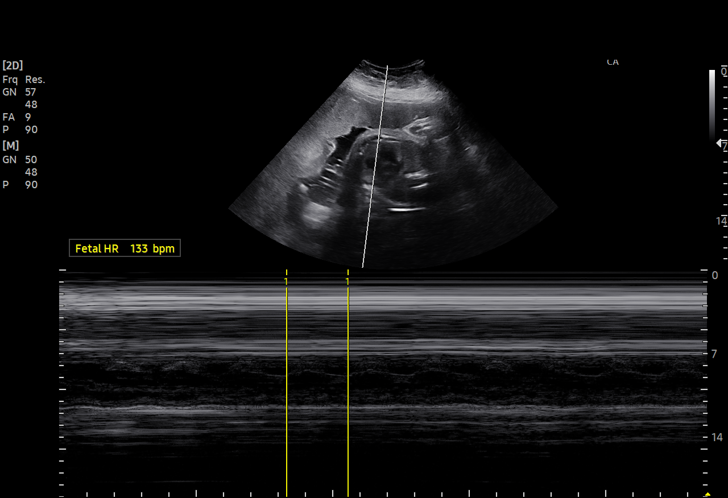
[im 10/26]
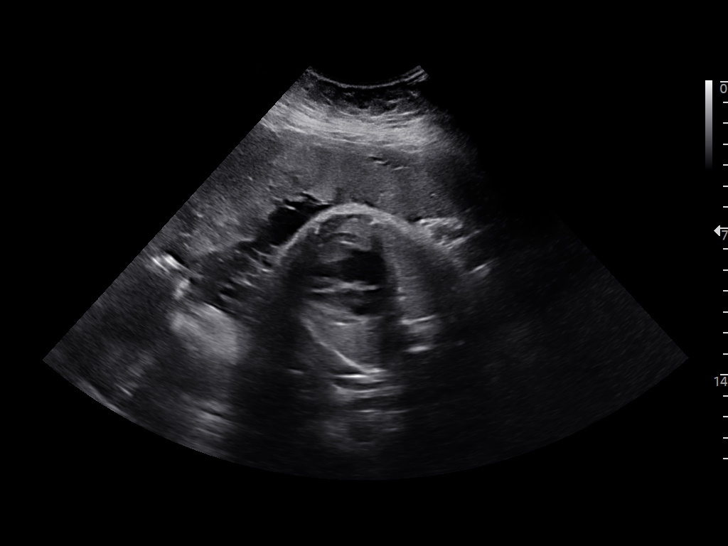
[im 12/26]
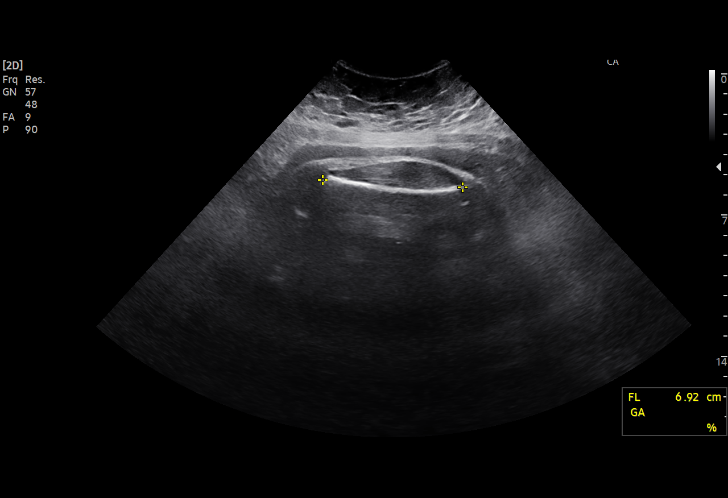
[im 14/26]
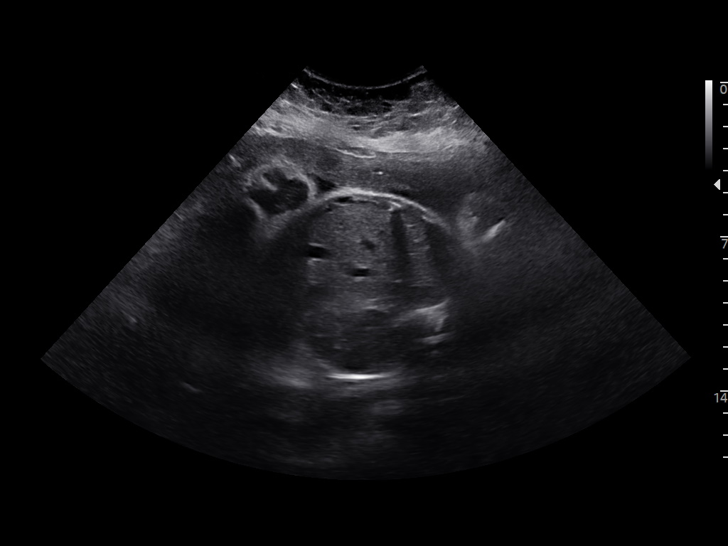
[im 15/26]
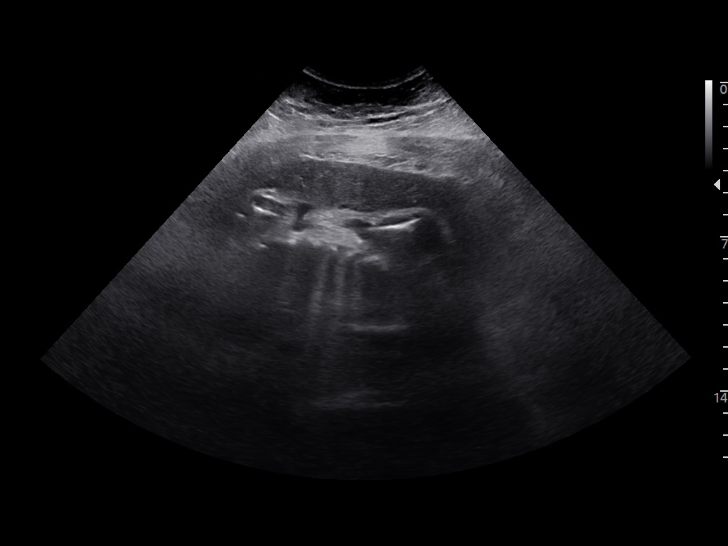
[im 17/26]
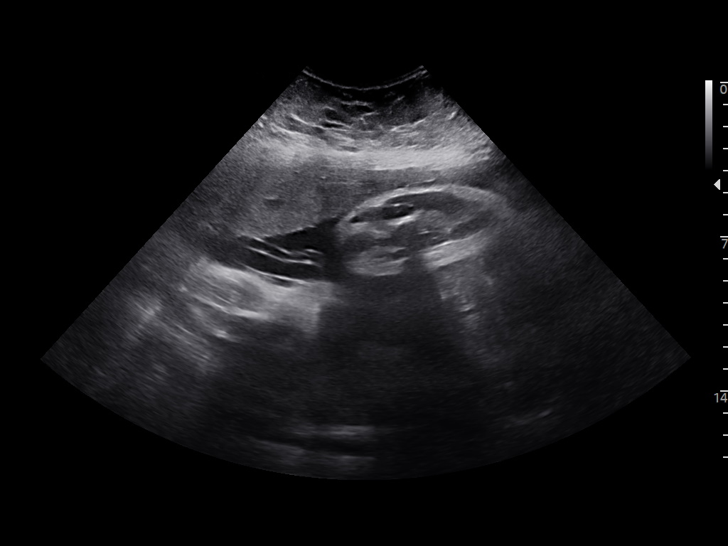
[im 19/26]
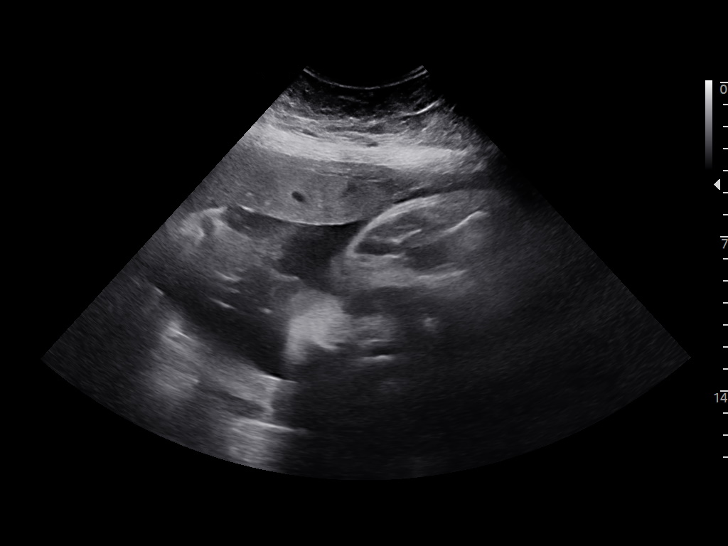
[im 20/26]
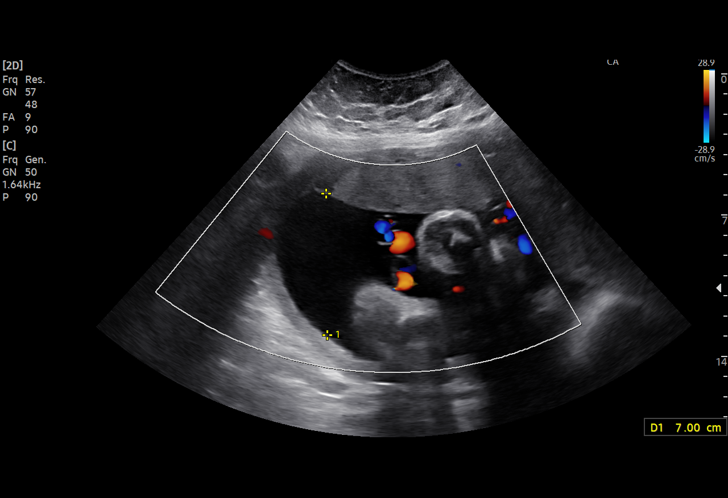
[im 22/26]
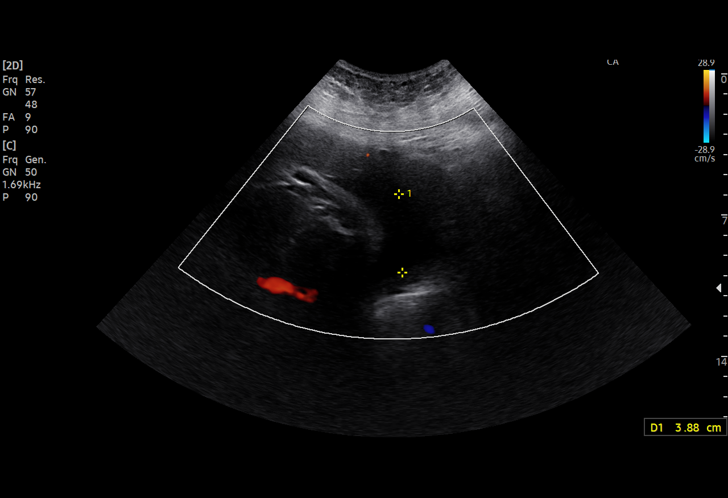
[im 24/26]
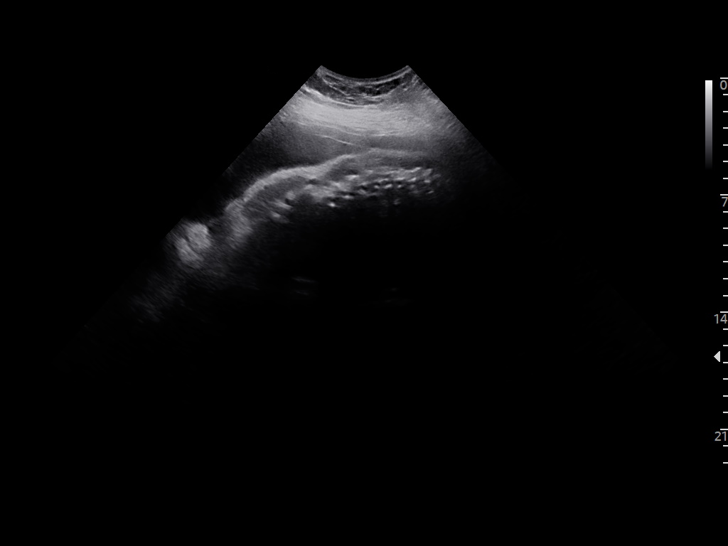
[im 26/26]
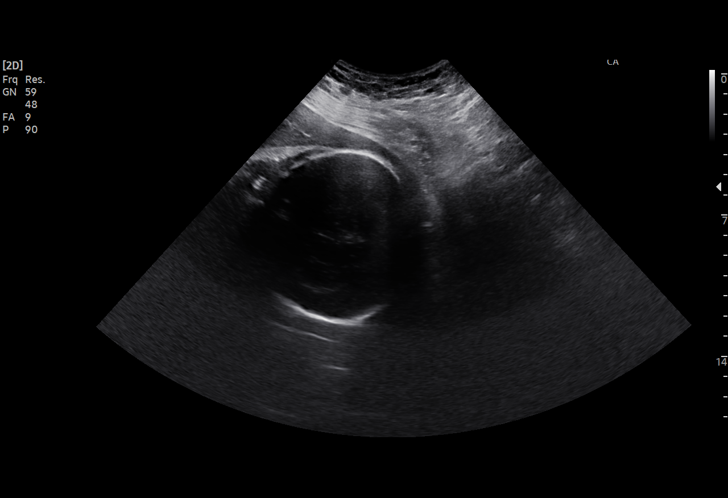

[15 of 26 positions shown; findings below may reference images not displayed]

FINDINGS: Number of Fetuses: 1

Heart Rate:  132 bpm

Movement: Yes

Presentation: Cephalic

Previa: No

Placental Location: Anterior

Amniotic Fluid (Subjective): Normal

AFI 13.6 cm

FL:  6.9cm 35w 2d

Maternal Findings:

Cervix:  Not well visualized on these transabdominal views.

Uterus/Adnexae: No abnormality visualized.

BIOPHYSICAL PROFILE

Movement: 2

Breathing: 2

Tone:  2

Amniotic Fluid: 2

Total Score:  8
IMPRESSION: 1. Single living intrauterine gestation in cephalic lie at 35 weeks
2 days by limited fetal biometry.
2. Normal biophysical profile score [DATE].
3. Normal amniotic fluid volume.  AFI 13.6.

## 2021-11-28 IMAGING — US US OB LIMITED
1 series · 15 of 26 positions shown · non-contrast
Comparison: none

CLINICAL DATA: 40-year-old pregnant female with diabetes mellitus.

EXAM:
LIMITED OBSTETRIC ULTRASOUND AND BIOPHYSICAL PROFILE

[Series 1: us fetal bpp wo nonstress (hospital) · 26 acquisitions, 15 frames shown]
[im 1/26]
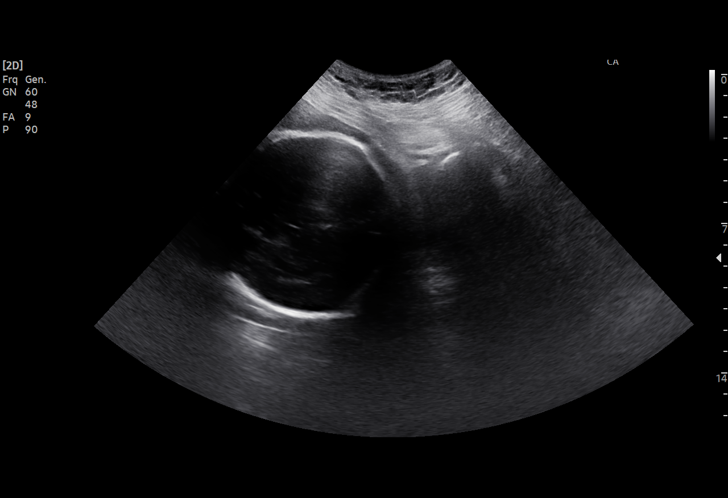
[im 3/26]
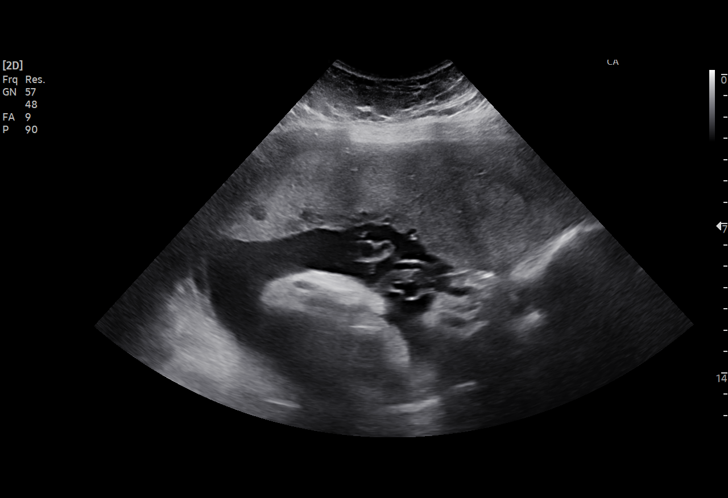
[im 5/26]
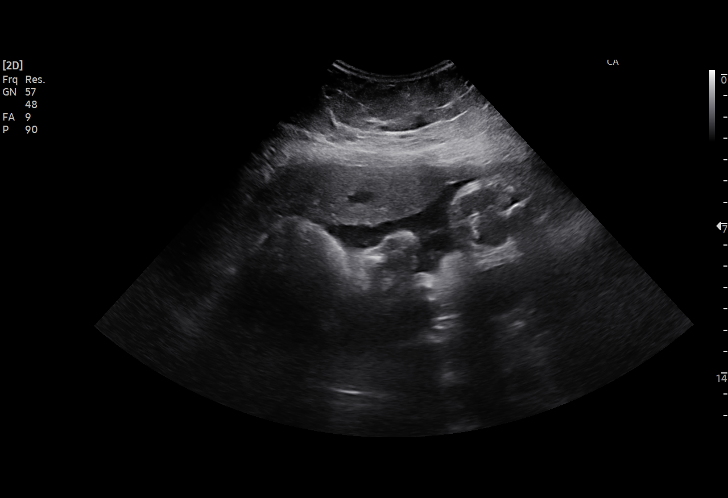
[im 7/26]
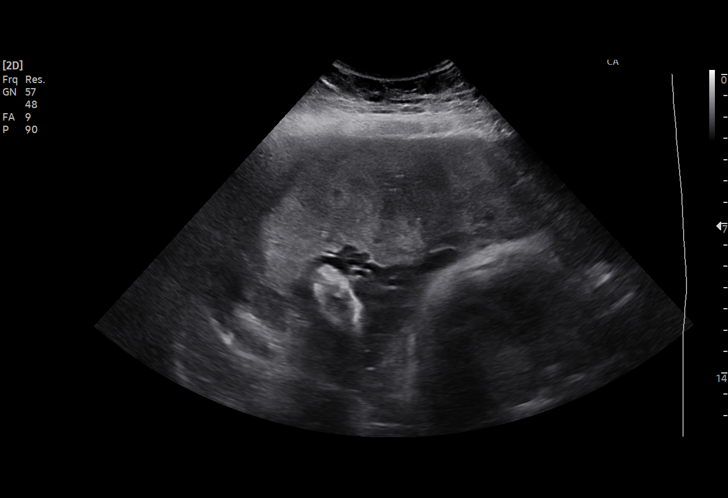
[im 8/26]
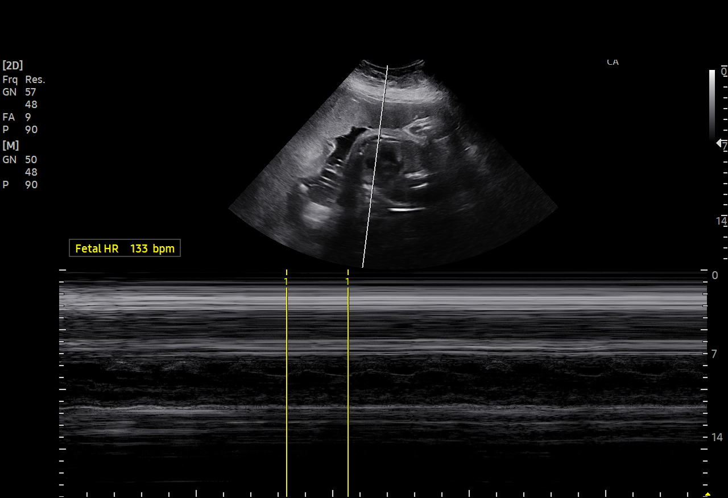
[im 10/26]
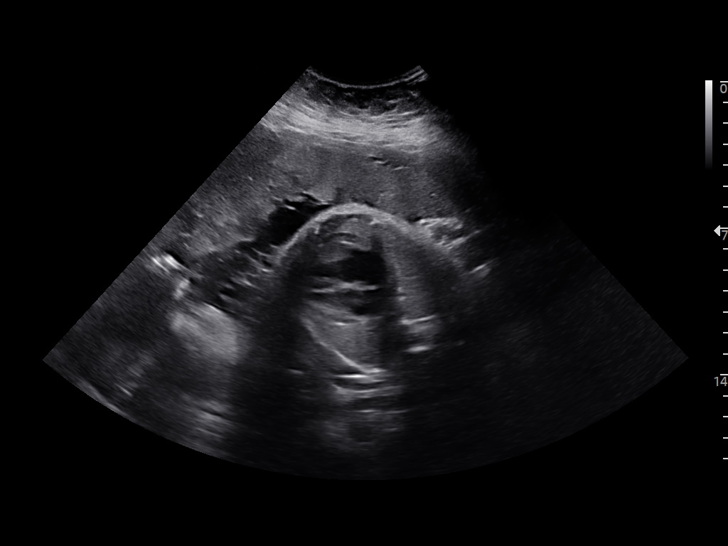
[im 12/26]
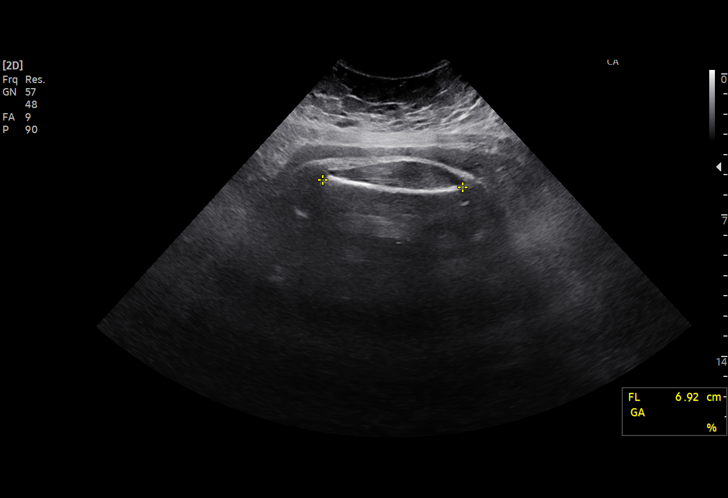
[im 14/26]
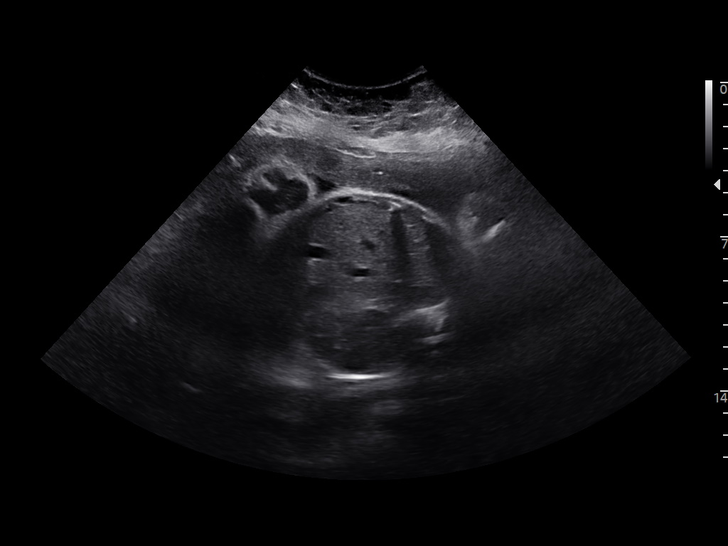
[im 15/26]
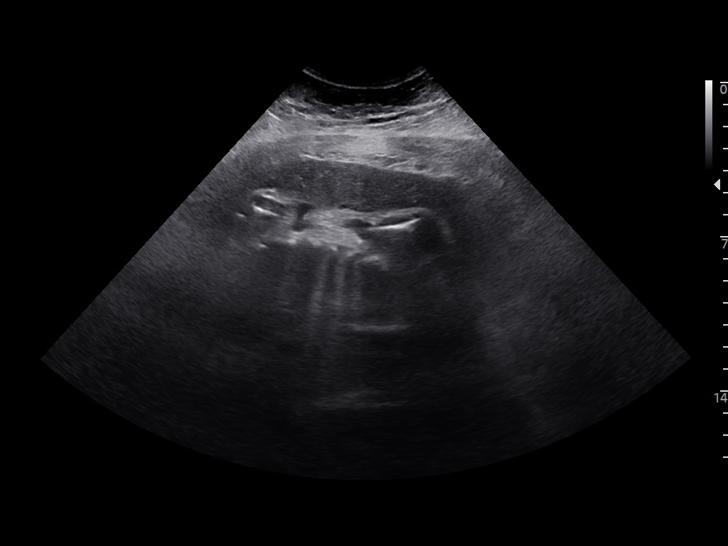
[im 17/26]
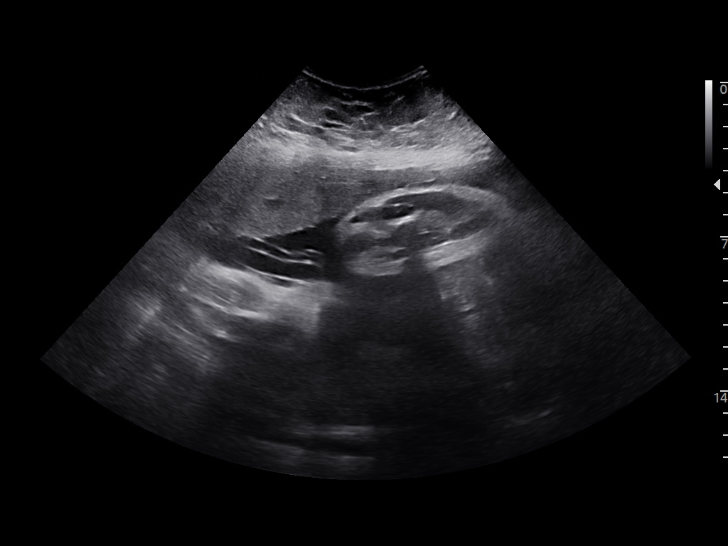
[im 19/26]
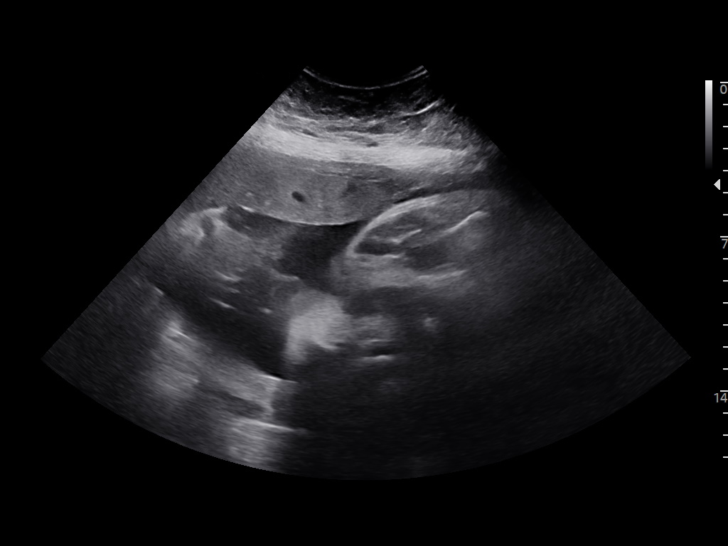
[im 20/26]
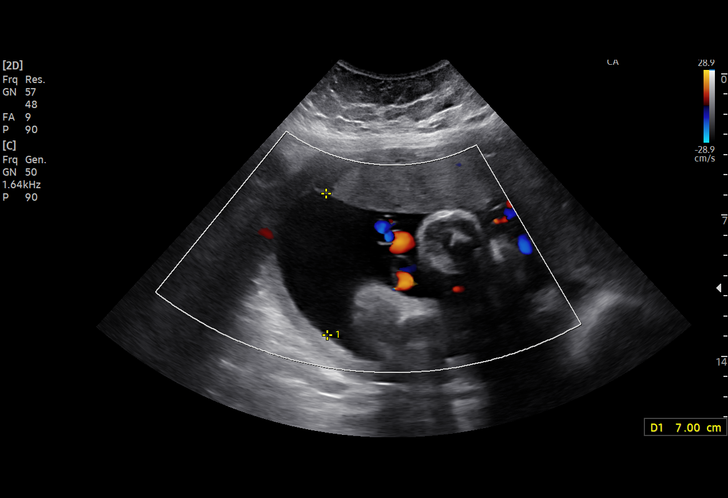
[im 22/26]
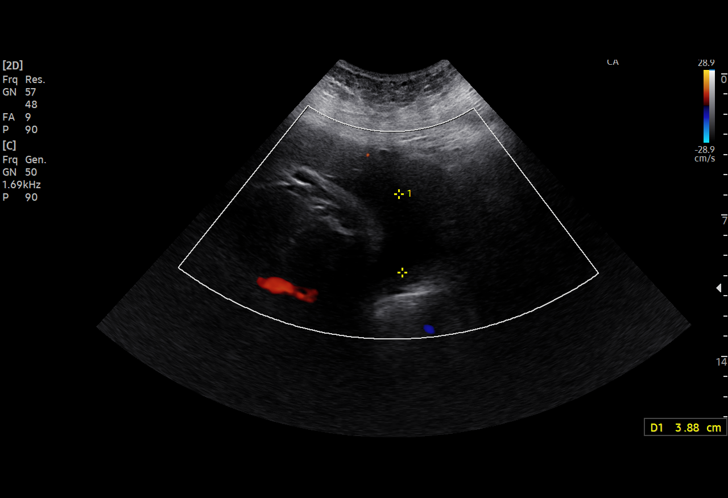
[im 24/26]
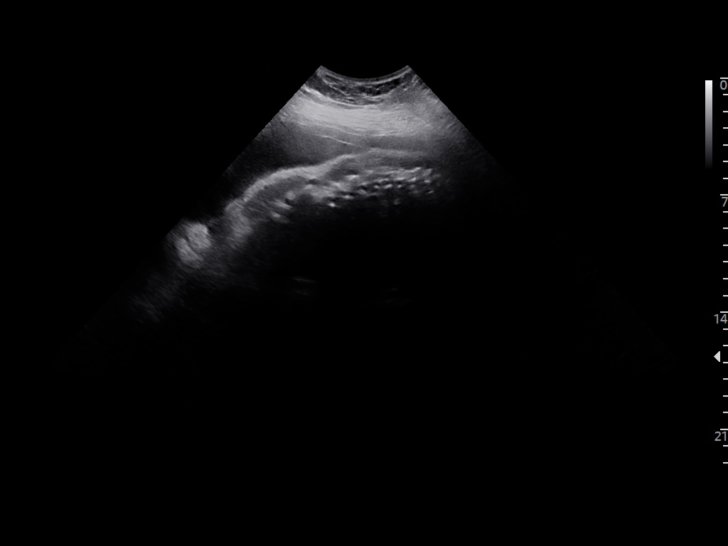
[im 26/26]
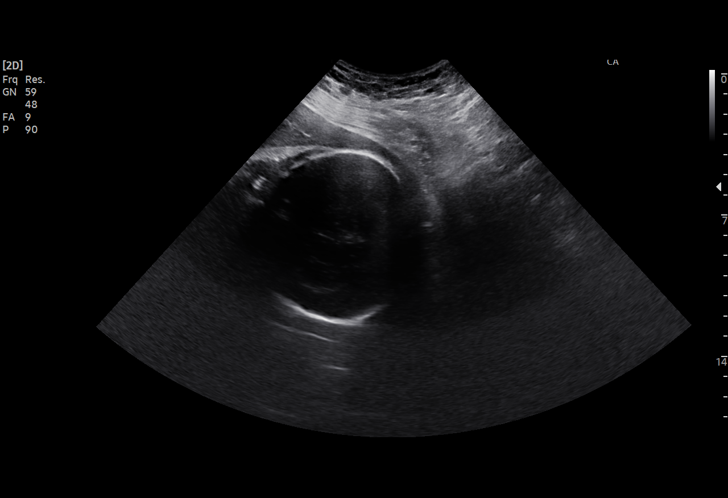

[15 of 26 positions shown; findings below may reference images not displayed]

FINDINGS: Number of Fetuses: 1

Heart Rate:  132 bpm

Movement: Yes

Presentation: Cephalic

Previa: No

Placental Location: Anterior

Amniotic Fluid (Subjective): Normal

AFI 13.6 cm

FL:  6.9cm 35w 2d

Maternal Findings:

Cervix:  Not well visualized on these transabdominal views.

Uterus/Adnexae: No abnormality visualized.

BIOPHYSICAL PROFILE

Movement: 2

Breathing: 2

Tone:  2

Amniotic Fluid: 2

Total Score:  8
IMPRESSION: 1. Single living intrauterine gestation in cephalic lie at 35 weeks
2 days by limited fetal biometry.
2. Normal biophysical profile score [DATE].
3. Normal amniotic fluid volume.  AFI 13.6.

## 2022-07-06 ENCOUNTER — Other Ambulatory Visit: Payer: Self-pay | Admitting: Internal Medicine

## 2022-07-06 DIAGNOSIS — Z1231 Encounter for screening mammogram for malignant neoplasm of breast: Secondary | ICD-10-CM

## 2022-08-25 ENCOUNTER — Ambulatory Visit
Admission: RE | Admit: 2022-08-25 | Discharge: 2022-08-25 | Disposition: A | Discharge status: Home / Self Care | Payer: Medicaid Other | Source: Ambulatory Visit | Attending: Internal Medicine | Admitting: Internal Medicine

## 2022-08-25 DIAGNOSIS — Z1231 Encounter for screening mammogram for malignant neoplasm of breast: Secondary | ICD-10-CM | POA: Diagnosis present

## 2022-08-31 ENCOUNTER — Other Ambulatory Visit: Payer: Self-pay | Admitting: Internal Medicine

## 2022-08-31 DIAGNOSIS — N6489 Other specified disorders of breast: Secondary | ICD-10-CM

## 2022-08-31 DIAGNOSIS — R928 Other abnormal and inconclusive findings on diagnostic imaging of breast: Secondary | ICD-10-CM

## 2022-09-07 ENCOUNTER — Ambulatory Visit: Admission: RE | Admit: 2022-09-07 | Payer: Medicaid Other | Source: Ambulatory Visit

## 2022-09-07 ENCOUNTER — Ambulatory Visit
Admission: RE | Admit: 2022-09-07 | Discharge: 2022-09-07 | Disposition: A | Discharge status: Home / Self Care | Payer: Medicaid Other | Source: Ambulatory Visit | Attending: Internal Medicine | Admitting: Internal Medicine

## 2022-09-07 DIAGNOSIS — N6489 Other specified disorders of breast: Secondary | ICD-10-CM | POA: Diagnosis present

## 2022-09-07 DIAGNOSIS — R928 Other abnormal and inconclusive findings on diagnostic imaging of breast: Secondary | ICD-10-CM | POA: Diagnosis not present

## 2023-02-05 ENCOUNTER — Other Ambulatory Visit: Payer: Self-pay | Admitting: Obstetrics and Gynecology

## 2023-02-05 DIAGNOSIS — Z1231 Encounter for screening mammogram for malignant neoplasm of breast: Secondary | ICD-10-CM

## 2023-04-30 ENCOUNTER — Other Ambulatory Visit: Payer: Self-pay

## 2023-04-30 ENCOUNTER — Emergency Department
Admission: EM | Admit: 2023-04-30 | Discharge: 2023-04-30 | Disposition: A | Discharge status: Home / Self Care | Payer: Medicaid Other | Attending: Emergency Medicine | Admitting: Emergency Medicine

## 2023-04-30 DIAGNOSIS — E119 Type 2 diabetes mellitus without complications: Secondary | ICD-10-CM | POA: Insufficient documentation

## 2023-04-30 DIAGNOSIS — M7918 Myalgia, other site: Secondary | ICD-10-CM | POA: Diagnosis not present

## 2023-04-30 DIAGNOSIS — J45909 Unspecified asthma, uncomplicated: Secondary | ICD-10-CM | POA: Diagnosis not present

## 2023-04-30 DIAGNOSIS — M542 Cervicalgia: Secondary | ICD-10-CM | POA: Diagnosis present

## 2023-04-30 DIAGNOSIS — S80212A Abrasion, left knee, initial encounter: Secondary | ICD-10-CM | POA: Insufficient documentation

## 2023-04-30 DIAGNOSIS — S1091XA Abrasion of unspecified part of neck, initial encounter: Secondary | ICD-10-CM | POA: Insufficient documentation

## 2023-04-30 DIAGNOSIS — Y92410 Unspecified street and highway as the place of occurrence of the external cause: Secondary | ICD-10-CM | POA: Insufficient documentation

## 2023-04-30 LAB — POC URINE PREG, ED: Preg Test, Ur: NEGATIVE

## 2023-04-30 MED ORDER — NAPROXEN 500 MG PO TABS
500.0000 mg | ORAL_TABLET | Freq: Once | ORAL | Status: AC
  Administered 2023-04-30: 500 mg via ORAL
  Filled 2023-04-30: qty 1

## 2023-04-30 MED ORDER — LIDOCAINE 5 % EX PTCH
1.0000 | MEDICATED_PATCH | Freq: Once | CUTANEOUS | Status: DC
  Administered 2023-04-30: 1 via TRANSDERMAL
  Filled 2023-04-30: qty 1

## 2023-04-30 MED ORDER — CYCLOBENZAPRINE HCL 10 MG PO TABS
10.0000 mg | ORAL_TABLET | Freq: Once | ORAL | Status: AC
  Administered 2023-04-30: 10 mg via ORAL
  Filled 2023-04-30: qty 1
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
## 2023-07-09 ENCOUNTER — Other Ambulatory Visit: Payer: Self-pay | Admitting: Internal Medicine

## 2023-07-09 DIAGNOSIS — R1319 Other dysphagia: Secondary | ICD-10-CM

## 2023-07-13 ENCOUNTER — Ambulatory Visit
Admission: RE | Admit: 2023-07-13 | Discharge: 2023-07-13 | Disposition: A | Discharge status: Home / Self Care | Source: Ambulatory Visit | Attending: Internal Medicine | Admitting: Internal Medicine

## 2023-07-13 DIAGNOSIS — R1319 Other dysphagia: Secondary | ICD-10-CM | POA: Insufficient documentation

## 2023-08-30 ENCOUNTER — Ambulatory Visit
Admission: RE | Admit: 2023-08-30 | Discharge: 2023-08-30 | Disposition: A | Discharge status: Home / Self Care | Source: Ambulatory Visit | Attending: Obstetrics and Gynecology | Admitting: Obstetrics and Gynecology

## 2023-08-30 ENCOUNTER — Encounter: Payer: Self-pay | Admitting: Radiology

## 2023-08-30 DIAGNOSIS — Z1231 Encounter for screening mammogram for malignant neoplasm of breast: Secondary | ICD-10-CM | POA: Insufficient documentation
